# Patient Record
Sex: Male | Born: 1945 | Race: White | Hispanic: No | Marital: Married | Smoking: Former smoker
Health system: Southern US, Community
[De-identification: ages and names within clinical notes are randomized; demographics above are authoritative.]

## PROBLEM LIST (undated history)

## (undated) HISTORY — PX: KIDNEY TRANSPLANT: SHX239

## (undated) HISTORY — PX: COMBINED KIDNEY-PANCREAS TRANSPLANT: SHX1382

---

## 1998-10-18 ENCOUNTER — Inpatient Hospital Stay (HOSPITAL_COMMUNITY): Admission: AD | Admit: 1998-10-18 | Discharge: 1998-10-21 | Payer: Self-pay | Admitting: *Deleted

## 1998-11-16 ENCOUNTER — Ambulatory Visit (HOSPITAL_COMMUNITY): Admission: RE | Admit: 1998-11-16 | Discharge: 1998-11-16 | Payer: Self-pay | Admitting: Vascular Surgery

## 1999-02-03 ENCOUNTER — Ambulatory Visit (HOSPITAL_COMMUNITY): Admission: RE | Admit: 1999-02-03 | Discharge: 1999-02-03 | Payer: Self-pay | Admitting: Nephrology

## 1999-05-26 ENCOUNTER — Ambulatory Visit (HOSPITAL_COMMUNITY): Admission: RE | Admit: 1999-05-26 | Discharge: 1999-05-26 | Payer: Self-pay | Admitting: Vascular Surgery

## 1999-05-26 ENCOUNTER — Encounter: Payer: Self-pay | Admitting: Vascular Surgery

## 2000-09-08 DIAGNOSIS — Z940 Kidney transplant status: Secondary | ICD-10-CM

## 2000-09-08 HISTORY — DX: Kidney transplant status: Z94.0

## 2003-02-22 ENCOUNTER — Encounter: Payer: Self-pay | Admitting: Nephrology

## 2003-02-22 ENCOUNTER — Ambulatory Visit (HOSPITAL_COMMUNITY): Admission: RE | Admit: 2003-02-22 | Discharge: 2003-02-22 | Payer: Self-pay | Admitting: Nephrology

## 2003-03-09 ENCOUNTER — Encounter: Payer: Self-pay | Admitting: Nephrology

## 2003-03-09 ENCOUNTER — Ambulatory Visit (HOSPITAL_COMMUNITY): Admission: RE | Admit: 2003-03-09 | Discharge: 2003-03-09 | Payer: Self-pay | Admitting: Nephrology

## 2004-03-04 IMAGING — XA IR ANGIO/RENAL UNI WO/W FLUSH*L*
1 series · 16 of 24 positions shown · IV contrast (gadolinium)
Comparison: none

FINDINGS
CLINICAL DATA: PATIENT IS STATUS POST PLACEMENT OF RENAL TRANSPLANT IN [DATE] AND HAS RECENTLY HAD
WORSENING RENAL FUNCTION AND HYPERTENSION.  MRA PERFORMED ON 02/22/03 HAS DEMONSTRATED THE PRESENCE
OF TWO TRANSPLANT ARTERIES SUPPLYING A LEFT RENAL TRANSPLANT WITH SUGGESTION OF POTENTIALLY AREAS
OF NARROWING INVOLVING BOTH TRANSPLANT ARTERIES.  PATIENT NOW PRESENTS FOR ARTERIOGRAPHIC
ASSESSMENT WITH POSSIBLE INTERVENTION AS APPROPRIATE.
ARTERIOGRAPHY OF LEFT RENAL TRANSPLANT 03/09/03
CONTRAST:  CO2 AND 12 CC GADOLINIUM.
FLUORO TIME:  13.8 MINUTES.
INFORMED CONSENT WAS OBTAINED.  CONSCIOUS SEDATION DURING THE PROCEDURE WAS PROVIDED WITH IV VERSED
AND FENTANYL.
THE RIGHT GROIN WAS STERILELY PREPPED AND DRAPED.  LOCAL ANESTHESIA WAS PROVIDED WITH 1 PERCENT
LIDOCAINE.  THE RIGHT COMMON FEMORAL ARTERY WAS ACCESSED UTILIZING A MICROPUNCTURE [DATE] FRENCH
OMNI-FLUSH CATHETER WAS ADVANCED OVER A GUIDE WIRE INTO THE ABDOMINAL AORTA.  THIS WAS FORMED IN
THE DESCENDING THORACIC AORTA AND BROUGHT DOWN TO CATHETERIZE THE CONTRALATERAL LEFT COMMON ILIAC
ARTERY.  CO2 ARTERIOGRAPHY OF THE LEFT PELVIS WAS THEN PERFORMED WITH INJECTION OF THE COMMON ILIAC
ARTERY.
A 5 FRENCH VASCULAR SHEATH WAS THEN ADVANCED OVER A GUIDE WIRE TO THE LEVEL OF THE LEFT COMMON
ILIAC ARTERY.  DIFFERENT DIAGNOSTIC CATHETERS WERE THEN UTILIZED.  A DIAGNOSTIC CATHETER WAS
ADVANCED OVER A GUIDE WIRE INTO THE INFERIOR TRANSPLANT RENAL ARTERY AND SELECTIVE ARTERIOGRAPHY
PERFORMED WITH GADOLINIUM.  A PULL BACK PRESSURE MEASUREMENT WAS OBTAINED ACROSS THE PROXIMAL
VESSEL AND BACK INTO THE LEFT COMMON ILIAC ARTERY.
A CATHETER WAS THEN USED TO SELECTIVELY CATHETERIZE THE SUPERIOR TRANSPLANT RENAL ARTERY OFF OF THE
LEFT COMMON ILIAC ARTERY.  SELECTIVE ARTERIOGRAPHY WAS PERFORMED WITH GADOLINIUM.  PULL BACK
PRESSURE WAS ALSO OBTAINED IN THIS VESSEL.
AFTER THE PROCEDURE, CATHETER AND SHEATH WERE REMOVED AND HEMOSTASIS OBTAINED WITH MANUAL
COMPRESSION.
COMPLICATIONS:  NONE.

[Series 1: run · 16 of 33 slices shown]
[im 1/33]
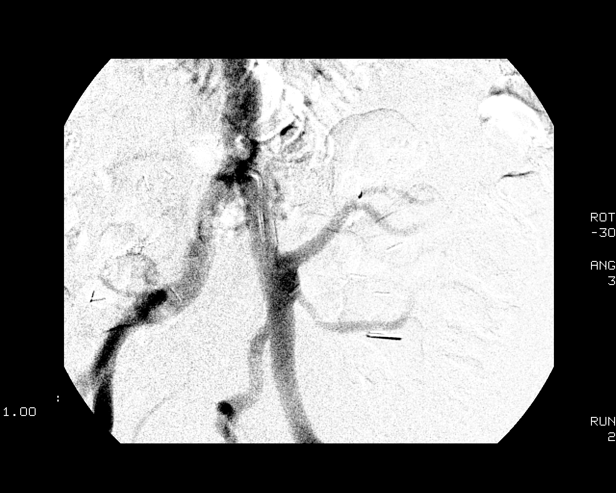
[im 3/33]
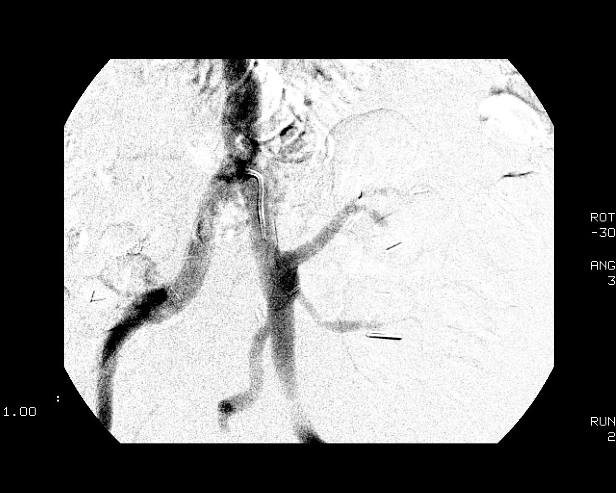
[im 5/33]
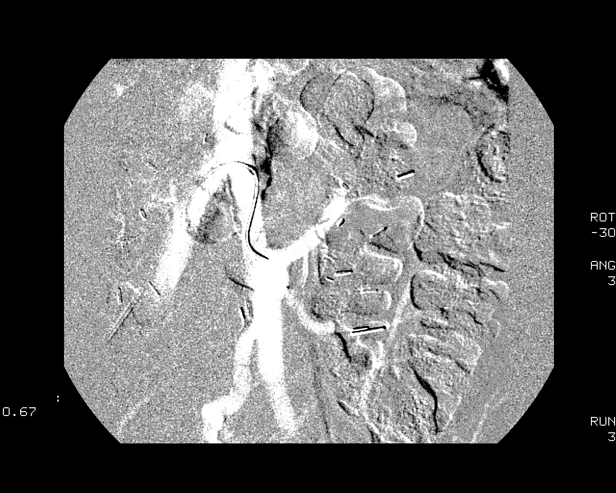
[im 7/33]
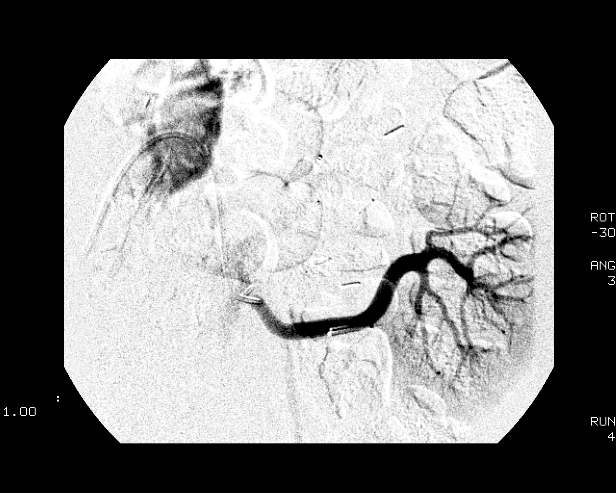
[im 9/33]
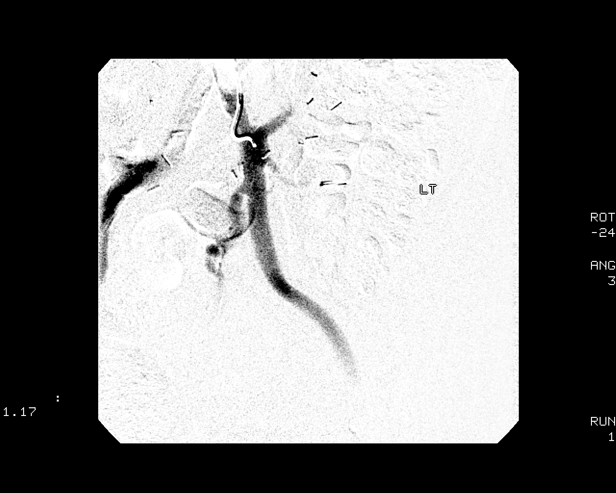
[im 12/33]
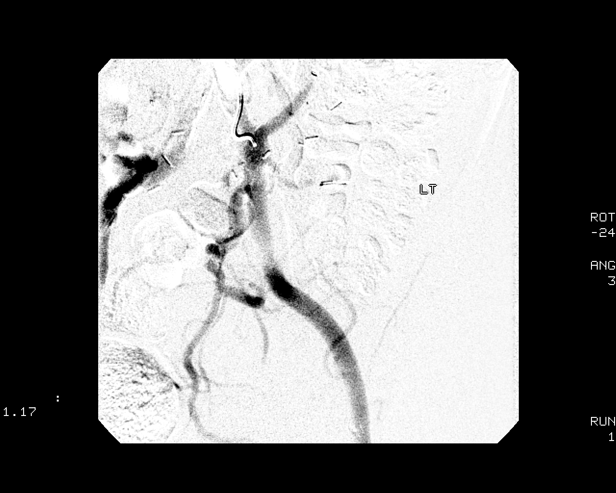
[im 13/33]
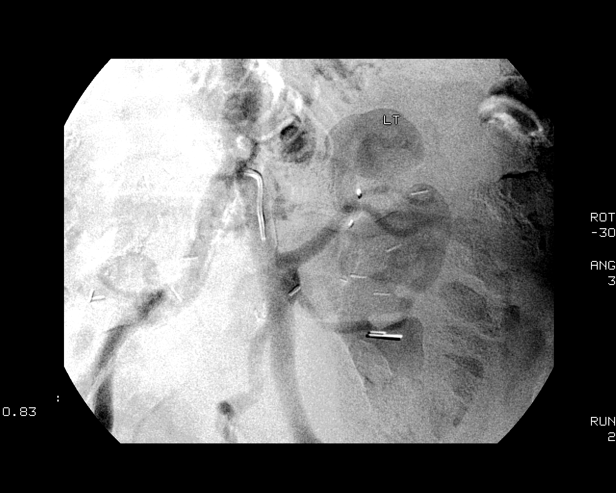
[im 16/33]
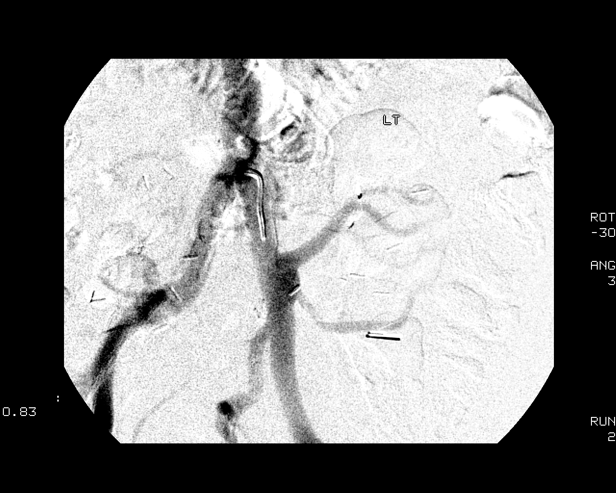
[im 17/33]
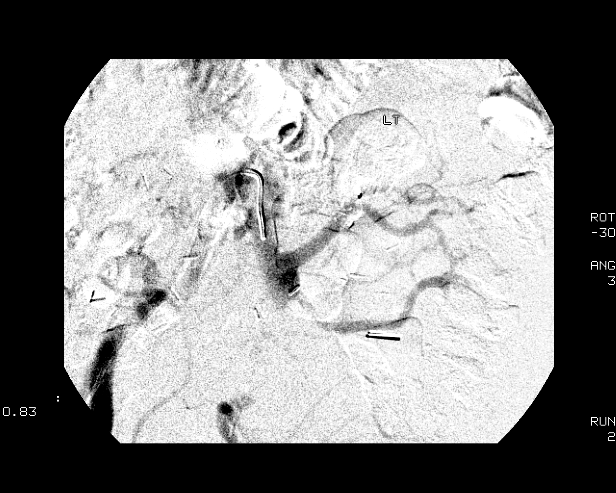
[im 20/33]
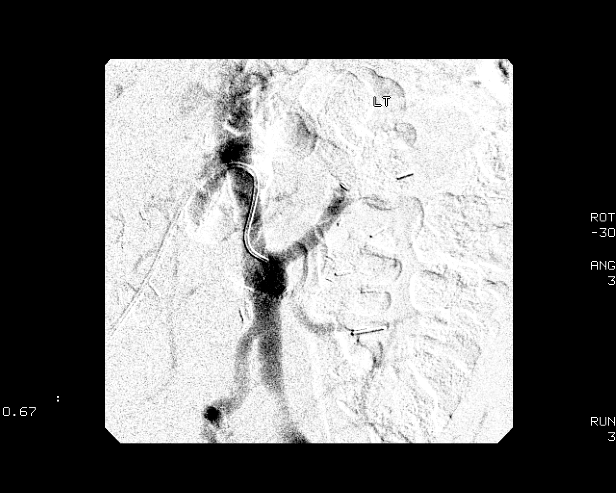
[im 21/33]
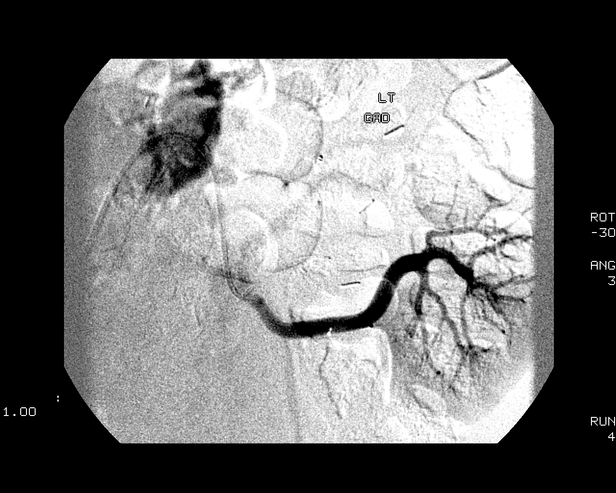
[im 24/33]
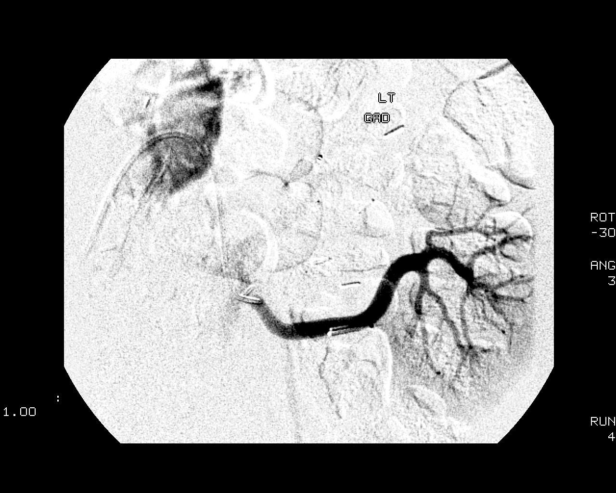
[im 26/33]
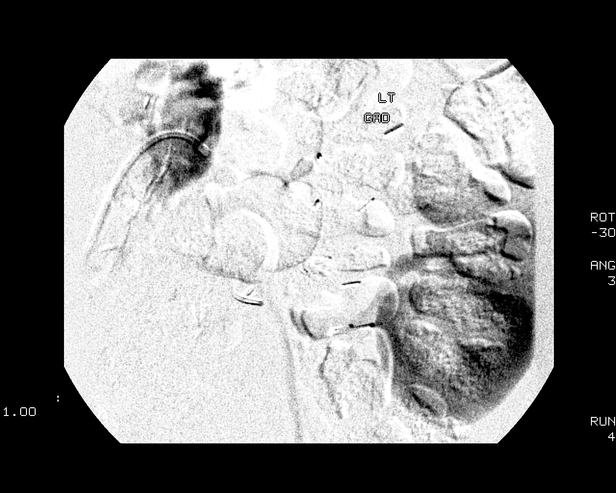
[im 28/33]
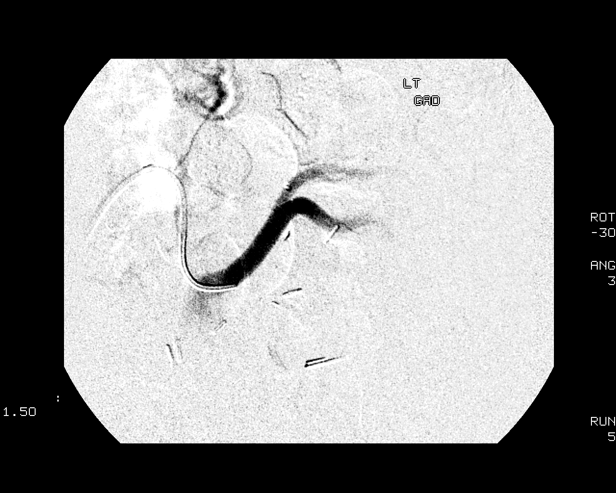
[im 30/33]
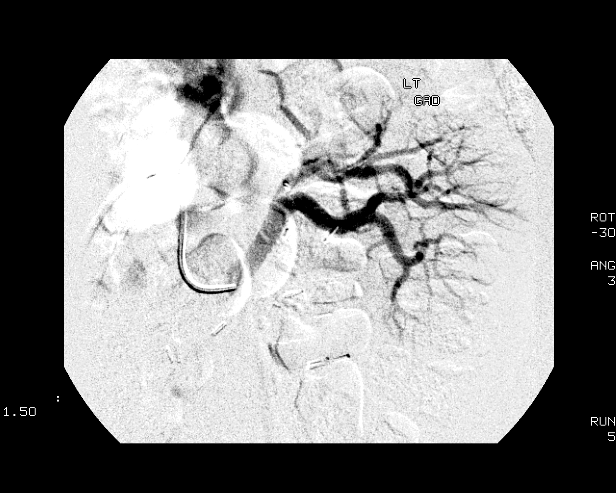
[im 33/33]
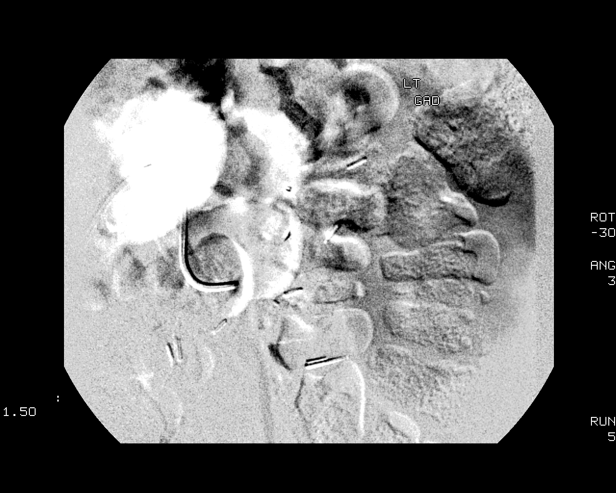

[16 of 24 positions shown; findings below may reference images not displayed]

FINDINGS: INITIAL CO2 ILIAC ARTERIOGRAM DEMONSTRATES A WIDELY PATENT LEFT COMMON ILIAC ARTERY
INCLUDING ITS ORIGIN.  REFLUX OF CO2 WAS ALSO ACCOMPLISHED INTO THE DISTAL AORTA WHICH IS NORMALLY
PATENT.  TWO SEPARATE TRANSPLANT RENAL ARTERIES ORIGINATE OFF OF THE DISTAL LEFT COMMON ILIAC
ARTERY AND SUPPLY THE LEFT ILIAC FOSSA TRANSPLANT.  THESE EMANATE OFF OF A PATULOUS SEGMENT OF THE
VESSEL BUT HAVE TWO DISTINCT ORIGINS.  BOTH OF THESE ORIGINS WERE WELL EVALUATED WITH PROXIMAL CO2
INJECTION DEMONSTRATING WIDELY PATENT ORIGINS AND NO EVIDENCE OF SIGNIFICANT STENOSIS.  THE
SUPERIOR TRANSPLANT RENAL ARTERY IS SLIGHTLY LARGER IN SIZE THAN THE INFERIOR ARTERY.
SELECTIVE ARTERIOGRAPHY OF THE INFERIOR TRANSPLANT ARTERY DEMONSTRATES A WIDELY PATENT VESSEL
WITHOUT EVIDENCE OF STENOSIS OR KINK.  MRA FINDINGS WERE LIKELY ON THE BASIS OF TWO ELONGATED
PARALLEL CLIPS LYING RIGHT AT AN AREA OF CURVATURE OF THE VESSEL LIKELY RESULTING IN THE MR
APPEARANCE OF POTENTIAL DISTAL VESSEL KINK VERSUS STENOSIS.  PULL BACK PRESSURE GRADIENT
MEASUREMENT WAS ACCOMPLISHED ACROSS THE PROXIMAL ARTERY DEMONSTRATING NO EVIDENCE OF RESTING
GRADIENT.  BRANCH VESSELS WERE FAIRLY WELL VISUALIZED AND ARE UNREMARKABLE IN APPEARANCE.  A
NEPHROGRAM PHASE WAS ALSO ABLE TO BE ACHIEVED.
SELECTIVE ARTERIOGRAPHY OF THE SUPERIOR TRANSPLANT RENAL ARTERY DEMONSTRATES A WIDELY PATENT VESSEL
SUPPLYING ROUGHLY 60 PERCENT OF THE TRANSPLANTED KIDNEY. THE BRANCH VESSELS HAVE A NORMAL
APPEARANCE AND NEPHROGRAM PHASE ALSO IS NORMAL.  PULL BACK PRESSURE MEASUREMENT ACROSS THE PROXIMAL
VESSEL SHOWS NO RESTING GRADIENT.
IMPRESSION
NORMAL APPEARANCE OF BOTH TRANSPLANT RENAL ARTERIES SUPPLYING THE LEFT ILIAC FOSSA TRANSPLANT
KIDNEY.  NO EVIDENCE OF RENAL ARTERY STENOSIS OR SIGNIFICANT ARTERY KINKING.  IN FLOW SUPPLY FROM
THE LEFT COMMON ILIAC ARTERY IS ALSO WIDELY PATENT.  NO EVIDENCE OF SIGNIFICANT ARTERIOGRAPHIC
EVIDENCE OF BRANCH VESSEL DISEASE.
         DIVISION OF NEPHROLOGY
        [REDACTED] [HOSPITAL]

## 2011-12-31 DIAGNOSIS — R197 Diarrhea, unspecified: Secondary | ICD-10-CM | POA: Diagnosis not present

## 2012-03-10 DIAGNOSIS — N189 Chronic kidney disease, unspecified: Secondary | ICD-10-CM | POA: Diagnosis not present

## 2012-03-10 DIAGNOSIS — Z6833 Body mass index (BMI) 33.0-33.9, adult: Secondary | ICD-10-CM | POA: Diagnosis not present

## 2012-03-10 DIAGNOSIS — I2510 Atherosclerotic heart disease of native coronary artery without angina pectoris: Secondary | ICD-10-CM | POA: Diagnosis not present

## 2012-03-10 DIAGNOSIS — E119 Type 2 diabetes mellitus without complications: Secondary | ICD-10-CM | POA: Diagnosis not present

## 2012-03-10 DIAGNOSIS — I10 Essential (primary) hypertension: Secondary | ICD-10-CM | POA: Diagnosis not present

## 2012-03-10 DIAGNOSIS — E785 Hyperlipidemia, unspecified: Secondary | ICD-10-CM | POA: Diagnosis not present

## 2012-03-10 DIAGNOSIS — Z79899 Other long term (current) drug therapy: Secondary | ICD-10-CM | POA: Diagnosis not present

## 2012-03-13 DIAGNOSIS — I6529 Occlusion and stenosis of unspecified carotid artery: Secondary | ICD-10-CM | POA: Diagnosis not present

## 2012-03-13 DIAGNOSIS — R0989 Other specified symptoms and signs involving the circulatory and respiratory systems: Secondary | ICD-10-CM | POA: Diagnosis not present

## 2012-03-13 DIAGNOSIS — I658 Occlusion and stenosis of other precerebral arteries: Secondary | ICD-10-CM | POA: Diagnosis not present

## 2012-04-14 DIAGNOSIS — I10 Essential (primary) hypertension: Secondary | ICD-10-CM | POA: Diagnosis not present

## 2012-04-14 DIAGNOSIS — Z9889 Other specified postprocedural states: Secondary | ICD-10-CM | POA: Diagnosis not present

## 2012-04-14 DIAGNOSIS — Z79899 Other long term (current) drug therapy: Secondary | ICD-10-CM | POA: Diagnosis not present

## 2012-04-14 DIAGNOSIS — Z940 Kidney transplant status: Secondary | ICD-10-CM | POA: Diagnosis not present

## 2012-04-14 DIAGNOSIS — Z48298 Encounter for aftercare following other organ transplant: Secondary | ICD-10-CM | POA: Diagnosis not present

## 2012-07-15 DIAGNOSIS — E119 Type 2 diabetes mellitus without complications: Secondary | ICD-10-CM | POA: Diagnosis not present

## 2012-07-15 DIAGNOSIS — I10 Essential (primary) hypertension: Secondary | ICD-10-CM | POA: Diagnosis not present

## 2012-07-15 DIAGNOSIS — E785 Hyperlipidemia, unspecified: Secondary | ICD-10-CM | POA: Diagnosis not present

## 2012-07-15 DIAGNOSIS — Z79899 Other long term (current) drug therapy: Secondary | ICD-10-CM | POA: Diagnosis not present

## 2012-07-15 DIAGNOSIS — N189 Chronic kidney disease, unspecified: Secondary | ICD-10-CM | POA: Diagnosis not present

## 2012-07-15 DIAGNOSIS — Z6827 Body mass index (BMI) 27.0-27.9, adult: Secondary | ICD-10-CM | POA: Diagnosis not present

## 2012-10-10 DIAGNOSIS — H579 Unspecified disorder of eye and adnexa: Secondary | ICD-10-CM | POA: Diagnosis not present

## 2012-10-10 DIAGNOSIS — E11359 Type 2 diabetes mellitus with proliferative diabetic retinopathy without macular edema: Secondary | ICD-10-CM | POA: Diagnosis not present

## 2012-10-10 DIAGNOSIS — Z961 Presence of intraocular lens: Secondary | ICD-10-CM

## 2012-10-10 DIAGNOSIS — H53002 Unspecified amblyopia, left eye: Secondary | ICD-10-CM

## 2012-10-10 DIAGNOSIS — E113599 Type 2 diabetes mellitus with proliferative diabetic retinopathy without macular edema, unspecified eye: Secondary | ICD-10-CM

## 2012-10-10 DIAGNOSIS — E1139 Type 2 diabetes mellitus with other diabetic ophthalmic complication: Secondary | ICD-10-CM | POA: Diagnosis not present

## 2012-10-10 DIAGNOSIS — H2512 Age-related nuclear cataract, left eye: Secondary | ICD-10-CM | POA: Insufficient documentation

## 2012-10-10 DIAGNOSIS — M8580 Other specified disorders of bone density and structure, unspecified site: Secondary | ICD-10-CM

## 2012-10-10 DIAGNOSIS — R0989 Other specified symptoms and signs involving the circulatory and respiratory systems: Secondary | ICD-10-CM

## 2012-10-10 DIAGNOSIS — I10 Essential (primary) hypertension: Secondary | ICD-10-CM

## 2012-10-10 DIAGNOSIS — I2510 Atherosclerotic heart disease of native coronary artery without angina pectoris: Secondary | ICD-10-CM

## 2012-10-10 DIAGNOSIS — E11311 Type 2 diabetes mellitus with unspecified diabetic retinopathy with macular edema: Secondary | ICD-10-CM

## 2012-10-10 HISTORY — DX: Essential (primary) hypertension: I10

## 2012-10-10 HISTORY — DX: Presence of intraocular lens: Z96.1

## 2012-10-10 HISTORY — DX: Type 2 diabetes mellitus with proliferative diabetic retinopathy without macular edema, unspecified eye: E11.3599

## 2012-10-10 HISTORY — DX: Atherosclerotic heart disease of native coronary artery without angina pectoris: I25.10

## 2012-10-10 HISTORY — DX: Other specified disorders of bone density and structure, unspecified site: M85.80

## 2012-10-10 HISTORY — DX: Unspecified amblyopia, left eye: H53.002

## 2012-10-10 HISTORY — DX: Type 2 diabetes mellitus with unspecified diabetic retinopathy with macular edema: E11.311

## 2012-10-10 HISTORY — DX: Age-related nuclear cataract, left eye: H25.12

## 2012-10-10 HISTORY — DX: Other specified symptoms and signs involving the circulatory and respiratory systems: R09.89

## 2012-10-14 DIAGNOSIS — E109 Type 1 diabetes mellitus without complications: Secondary | ICD-10-CM

## 2012-10-14 HISTORY — DX: Type 1 diabetes mellitus without complications: E10.9

## 2012-10-17 DIAGNOSIS — Z940 Kidney transplant status: Secondary | ICD-10-CM | POA: Diagnosis not present

## 2012-10-17 DIAGNOSIS — Z23 Encounter for immunization: Secondary | ICD-10-CM | POA: Diagnosis not present

## 2012-10-17 DIAGNOSIS — E109 Type 1 diabetes mellitus without complications: Secondary | ICD-10-CM | POA: Diagnosis not present

## 2012-10-17 DIAGNOSIS — I10 Essential (primary) hypertension: Secondary | ICD-10-CM | POA: Diagnosis not present

## 2012-10-30 DIAGNOSIS — H52219 Irregular astigmatism, unspecified eye: Secondary | ICD-10-CM | POA: Diagnosis not present

## 2012-12-12 DIAGNOSIS — E119 Type 2 diabetes mellitus without complications: Secondary | ICD-10-CM | POA: Diagnosis not present

## 2012-12-12 DIAGNOSIS — Z125 Encounter for screening for malignant neoplasm of prostate: Secondary | ICD-10-CM | POA: Diagnosis not present

## 2012-12-12 DIAGNOSIS — I10 Essential (primary) hypertension: Secondary | ICD-10-CM | POA: Diagnosis not present

## 2012-12-12 DIAGNOSIS — Z6827 Body mass index (BMI) 27.0-27.9, adult: Secondary | ICD-10-CM | POA: Diagnosis not present

## 2012-12-12 DIAGNOSIS — N189 Chronic kidney disease, unspecified: Secondary | ICD-10-CM | POA: Diagnosis not present

## 2012-12-12 DIAGNOSIS — E785 Hyperlipidemia, unspecified: Secondary | ICD-10-CM | POA: Diagnosis not present

## 2013-02-13 DIAGNOSIS — I10 Essential (primary) hypertension: Secondary | ICD-10-CM | POA: Diagnosis not present

## 2013-02-13 DIAGNOSIS — E785 Hyperlipidemia, unspecified: Secondary | ICD-10-CM | POA: Diagnosis not present

## 2013-02-13 DIAGNOSIS — E119 Type 2 diabetes mellitus without complications: Secondary | ICD-10-CM | POA: Diagnosis not present

## 2013-02-19 DIAGNOSIS — I6529 Occlusion and stenosis of unspecified carotid artery: Secondary | ICD-10-CM | POA: Diagnosis not present

## 2013-02-19 DIAGNOSIS — I658 Occlusion and stenosis of other precerebral arteries: Secondary | ICD-10-CM | POA: Diagnosis not present

## 2013-03-13 DIAGNOSIS — N189 Chronic kidney disease, unspecified: Secondary | ICD-10-CM | POA: Diagnosis not present

## 2013-04-17 DIAGNOSIS — Z940 Kidney transplant status: Secondary | ICD-10-CM | POA: Diagnosis not present

## 2013-04-17 DIAGNOSIS — Z79899 Other long term (current) drug therapy: Secondary | ICD-10-CM | POA: Diagnosis not present

## 2013-04-17 DIAGNOSIS — I10 Essential (primary) hypertension: Secondary | ICD-10-CM | POA: Diagnosis not present

## 2013-04-28 DIAGNOSIS — E11311 Type 2 diabetes mellitus with unspecified diabetic retinopathy with macular edema: Secondary | ICD-10-CM | POA: Diagnosis not present

## 2013-04-28 DIAGNOSIS — H543 Unqualified visual loss, both eyes: Secondary | ICD-10-CM | POA: Diagnosis not present

## 2013-04-28 DIAGNOSIS — E11359 Type 2 diabetes mellitus with proliferative diabetic retinopathy without macular edema: Secondary | ICD-10-CM | POA: Diagnosis not present

## 2013-04-28 DIAGNOSIS — E1139 Type 2 diabetes mellitus with other diabetic ophthalmic complication: Secondary | ICD-10-CM | POA: Diagnosis not present

## 2013-07-17 DIAGNOSIS — I10 Essential (primary) hypertension: Secondary | ICD-10-CM | POA: Diagnosis not present

## 2013-07-17 DIAGNOSIS — E785 Hyperlipidemia, unspecified: Secondary | ICD-10-CM | POA: Diagnosis not present

## 2013-07-17 DIAGNOSIS — N189 Chronic kidney disease, unspecified: Secondary | ICD-10-CM | POA: Diagnosis not present

## 2013-07-17 DIAGNOSIS — Z1331 Encounter for screening for depression: Secondary | ICD-10-CM | POA: Diagnosis not present

## 2013-07-17 DIAGNOSIS — Z9181 History of falling: Secondary | ICD-10-CM | POA: Diagnosis not present

## 2013-07-17 DIAGNOSIS — Z79899 Other long term (current) drug therapy: Secondary | ICD-10-CM | POA: Diagnosis not present

## 2013-07-17 DIAGNOSIS — E119 Type 2 diabetes mellitus without complications: Secondary | ICD-10-CM | POA: Diagnosis not present

## 2013-10-09 DIAGNOSIS — I10 Essential (primary) hypertension: Secondary | ICD-10-CM | POA: Diagnosis not present

## 2013-10-09 DIAGNOSIS — Z79899 Other long term (current) drug therapy: Secondary | ICD-10-CM | POA: Diagnosis not present

## 2013-10-09 DIAGNOSIS — Z9483 Pancreas transplant status: Secondary | ICD-10-CM | POA: Diagnosis not present

## 2013-10-09 DIAGNOSIS — Z940 Kidney transplant status: Secondary | ICD-10-CM | POA: Diagnosis not present

## 2013-11-06 DIAGNOSIS — E1139 Type 2 diabetes mellitus with other diabetic ophthalmic complication: Secondary | ICD-10-CM | POA: Diagnosis not present

## 2013-11-06 DIAGNOSIS — H543 Unqualified visual loss, both eyes: Secondary | ICD-10-CM | POA: Diagnosis not present

## 2013-11-06 DIAGNOSIS — E11359 Type 2 diabetes mellitus with proliferative diabetic retinopathy without macular edema: Secondary | ICD-10-CM | POA: Diagnosis not present

## 2013-11-24 DIAGNOSIS — I10 Essential (primary) hypertension: Secondary | ICD-10-CM | POA: Diagnosis not present

## 2013-11-24 DIAGNOSIS — E119 Type 2 diabetes mellitus without complications: Secondary | ICD-10-CM | POA: Diagnosis not present

## 2013-11-24 DIAGNOSIS — Z79899 Other long term (current) drug therapy: Secondary | ICD-10-CM | POA: Diagnosis not present

## 2013-11-24 DIAGNOSIS — N189 Chronic kidney disease, unspecified: Secondary | ICD-10-CM | POA: Diagnosis not present

## 2013-11-24 DIAGNOSIS — Z6828 Body mass index (BMI) 28.0-28.9, adult: Secondary | ICD-10-CM | POA: Diagnosis not present

## 2013-11-24 DIAGNOSIS — E785 Hyperlipidemia, unspecified: Secondary | ICD-10-CM | POA: Diagnosis not present

## 2013-12-03 DIAGNOSIS — N189 Chronic kidney disease, unspecified: Secondary | ICD-10-CM | POA: Diagnosis not present

## 2014-01-21 DIAGNOSIS — Z6829 Body mass index (BMI) 29.0-29.9, adult: Secondary | ICD-10-CM | POA: Diagnosis not present

## 2014-01-21 DIAGNOSIS — J209 Acute bronchitis, unspecified: Secondary | ICD-10-CM | POA: Diagnosis not present

## 2014-01-21 DIAGNOSIS — J019 Acute sinusitis, unspecified: Secondary | ICD-10-CM | POA: Diagnosis not present

## 2014-01-21 DIAGNOSIS — J029 Acute pharyngitis, unspecified: Secondary | ICD-10-CM | POA: Diagnosis not present

## 2014-02-25 DIAGNOSIS — Z79899 Other long term (current) drug therapy: Secondary | ICD-10-CM | POA: Diagnosis not present

## 2014-02-25 DIAGNOSIS — I10 Essential (primary) hypertension: Secondary | ICD-10-CM | POA: Diagnosis not present

## 2014-02-25 DIAGNOSIS — N189 Chronic kidney disease, unspecified: Secondary | ICD-10-CM | POA: Diagnosis not present

## 2014-02-25 DIAGNOSIS — Z125 Encounter for screening for malignant neoplasm of prostate: Secondary | ICD-10-CM | POA: Diagnosis not present

## 2014-02-25 DIAGNOSIS — Z6828 Body mass index (BMI) 28.0-28.9, adult: Secondary | ICD-10-CM | POA: Diagnosis not present

## 2014-02-25 DIAGNOSIS — E119 Type 2 diabetes mellitus without complications: Secondary | ICD-10-CM | POA: Diagnosis not present

## 2014-02-25 DIAGNOSIS — E785 Hyperlipidemia, unspecified: Secondary | ICD-10-CM | POA: Diagnosis not present

## 2014-03-29 DIAGNOSIS — E11319 Type 2 diabetes mellitus with unspecified diabetic retinopathy without macular edema: Secondary | ICD-10-CM | POA: Diagnosis present

## 2014-03-29 DIAGNOSIS — J988 Other specified respiratory disorders: Secondary | ICD-10-CM | POA: Diagnosis not present

## 2014-03-29 DIAGNOSIS — I10 Essential (primary) hypertension: Secondary | ICD-10-CM | POA: Diagnosis present

## 2014-03-29 DIAGNOSIS — Z79899 Other long term (current) drug therapy: Secondary | ICD-10-CM | POA: Diagnosis not present

## 2014-03-29 DIAGNOSIS — I739 Peripheral vascular disease, unspecified: Secondary | ICD-10-CM | POA: Diagnosis present

## 2014-03-29 DIAGNOSIS — D72829 Elevated white blood cell count, unspecified: Secondary | ICD-10-CM | POA: Diagnosis not present

## 2014-03-29 DIAGNOSIS — I959 Hypotension, unspecified: Secondary | ICD-10-CM | POA: Diagnosis not present

## 2014-03-29 DIAGNOSIS — I2510 Atherosclerotic heart disease of native coronary artery without angina pectoris: Secondary | ICD-10-CM | POA: Diagnosis present

## 2014-03-29 DIAGNOSIS — E1029 Type 1 diabetes mellitus with other diabetic kidney complication: Secondary | ICD-10-CM | POA: Diagnosis not present

## 2014-03-29 DIAGNOSIS — J398 Other specified diseases of upper respiratory tract: Secondary | ICD-10-CM | POA: Diagnosis not present

## 2014-03-29 DIAGNOSIS — J209 Acute bronchitis, unspecified: Secondary | ICD-10-CM | POA: Diagnosis present

## 2014-03-29 DIAGNOSIS — Z940 Kidney transplant status: Secondary | ICD-10-CM | POA: Diagnosis not present

## 2014-03-29 DIAGNOSIS — Z9483 Pancreas transplant status: Secondary | ICD-10-CM | POA: Diagnosis not present

## 2014-03-29 DIAGNOSIS — Z947 Corneal transplant status: Secondary | ICD-10-CM | POA: Diagnosis not present

## 2014-03-29 DIAGNOSIS — J9600 Acute respiratory failure, unspecified whether with hypoxia or hypercapnia: Secondary | ICD-10-CM | POA: Diagnosis not present

## 2014-03-29 DIAGNOSIS — N185 Chronic kidney disease, stage 5: Secondary | ICD-10-CM | POA: Diagnosis present

## 2014-03-29 DIAGNOSIS — E1039 Type 1 diabetes mellitus with other diabetic ophthalmic complication: Secondary | ICD-10-CM | POA: Diagnosis present

## 2014-03-29 DIAGNOSIS — D899 Disorder involving the immune mechanism, unspecified: Secondary | ICD-10-CM | POA: Diagnosis not present

## 2014-03-29 DIAGNOSIS — R509 Fever, unspecified: Secondary | ICD-10-CM | POA: Diagnosis not present

## 2014-03-29 DIAGNOSIS — R0902 Hypoxemia: Secondary | ICD-10-CM | POA: Diagnosis not present

## 2014-03-29 DIAGNOSIS — J159 Unspecified bacterial pneumonia: Secondary | ICD-10-CM | POA: Diagnosis not present

## 2014-03-29 DIAGNOSIS — Z23 Encounter for immunization: Secondary | ICD-10-CM | POA: Diagnosis not present

## 2014-03-29 DIAGNOSIS — E7800 Pure hypercholesterolemia, unspecified: Secondary | ICD-10-CM | POA: Diagnosis present

## 2014-04-02 DIAGNOSIS — J159 Unspecified bacterial pneumonia: Secondary | ICD-10-CM | POA: Diagnosis not present

## 2014-04-02 DIAGNOSIS — Z6828 Body mass index (BMI) 28.0-28.9, adult: Secondary | ICD-10-CM | POA: Diagnosis not present

## 2014-04-02 DIAGNOSIS — J9600 Acute respiratory failure, unspecified whether with hypoxia or hypercapnia: Secondary | ICD-10-CM | POA: Diagnosis not present

## 2014-04-16 DIAGNOSIS — Z79899 Other long term (current) drug therapy: Secondary | ICD-10-CM | POA: Diagnosis not present

## 2014-04-16 DIAGNOSIS — Z940 Kidney transplant status: Secondary | ICD-10-CM | POA: Diagnosis not present

## 2014-04-16 DIAGNOSIS — Z9483 Pancreas transplant status: Secondary | ICD-10-CM | POA: Diagnosis not present

## 2014-04-16 DIAGNOSIS — I10 Essential (primary) hypertension: Secondary | ICD-10-CM | POA: Diagnosis not present

## 2014-05-27 DIAGNOSIS — J029 Acute pharyngitis, unspecified: Secondary | ICD-10-CM | POA: Diagnosis not present

## 2014-05-27 DIAGNOSIS — J209 Acute bronchitis, unspecified: Secondary | ICD-10-CM | POA: Diagnosis not present

## 2014-05-27 DIAGNOSIS — Z6828 Body mass index (BMI) 28.0-28.9, adult: Secondary | ICD-10-CM | POA: Diagnosis not present

## 2014-06-03 DIAGNOSIS — I10 Essential (primary) hypertension: Secondary | ICD-10-CM | POA: Diagnosis not present

## 2014-06-03 DIAGNOSIS — N189 Chronic kidney disease, unspecified: Secondary | ICD-10-CM | POA: Diagnosis not present

## 2014-06-03 DIAGNOSIS — E119 Type 2 diabetes mellitus without complications: Secondary | ICD-10-CM | POA: Diagnosis not present

## 2014-06-03 DIAGNOSIS — E785 Hyperlipidemia, unspecified: Secondary | ICD-10-CM | POA: Diagnosis not present

## 2014-07-05 DIAGNOSIS — N189 Chronic kidney disease, unspecified: Secondary | ICD-10-CM | POA: Diagnosis not present

## 2014-09-10 DIAGNOSIS — I10 Essential (primary) hypertension: Secondary | ICD-10-CM | POA: Diagnosis not present

## 2014-09-10 DIAGNOSIS — Z6828 Body mass index (BMI) 28.0-28.9, adult: Secondary | ICD-10-CM | POA: Diagnosis not present

## 2014-09-10 DIAGNOSIS — E119 Type 2 diabetes mellitus without complications: Secondary | ICD-10-CM | POA: Diagnosis not present

## 2014-09-10 DIAGNOSIS — Z9181 History of falling: Secondary | ICD-10-CM | POA: Diagnosis not present

## 2014-09-10 DIAGNOSIS — Z79899 Other long term (current) drug therapy: Secondary | ICD-10-CM | POA: Diagnosis not present

## 2014-09-10 DIAGNOSIS — E785 Hyperlipidemia, unspecified: Secondary | ICD-10-CM | POA: Diagnosis not present

## 2014-09-10 DIAGNOSIS — Z1331 Encounter for screening for depression: Secondary | ICD-10-CM | POA: Diagnosis not present

## 2014-09-10 DIAGNOSIS — N189 Chronic kidney disease, unspecified: Secondary | ICD-10-CM | POA: Diagnosis not present

## 2014-10-15 DIAGNOSIS — M25551 Pain in right hip: Secondary | ICD-10-CM | POA: Diagnosis not present

## 2014-10-15 DIAGNOSIS — Z4822 Encounter for aftercare following kidney transplant: Secondary | ICD-10-CM | POA: Diagnosis not present

## 2014-10-15 DIAGNOSIS — Z9483 Pancreas transplant status: Secondary | ICD-10-CM | POA: Diagnosis not present

## 2014-10-15 DIAGNOSIS — Z23 Encounter for immunization: Secondary | ICD-10-CM | POA: Diagnosis not present

## 2014-10-15 DIAGNOSIS — Z5181 Encounter for therapeutic drug level monitoring: Secondary | ICD-10-CM | POA: Diagnosis not present

## 2014-10-15 DIAGNOSIS — Z7952 Long term (current) use of systemic steroids: Secondary | ICD-10-CM | POA: Diagnosis not present

## 2014-10-15 DIAGNOSIS — Z87891 Personal history of nicotine dependence: Secondary | ICD-10-CM | POA: Diagnosis not present

## 2014-10-15 DIAGNOSIS — Z7982 Long term (current) use of aspirin: Secondary | ICD-10-CM | POA: Diagnosis not present

## 2014-10-15 DIAGNOSIS — I10 Essential (primary) hypertension: Secondary | ICD-10-CM | POA: Diagnosis not present

## 2014-10-15 DIAGNOSIS — Z9225 Personal history of immunosupression therapy: Secondary | ICD-10-CM | POA: Diagnosis not present

## 2014-10-15 DIAGNOSIS — Z792 Long term (current) use of antibiotics: Secondary | ICD-10-CM | POA: Diagnosis not present

## 2014-10-15 DIAGNOSIS — Z940 Kidney transplant status: Secondary | ICD-10-CM | POA: Diagnosis not present

## 2014-10-15 DIAGNOSIS — Z79899 Other long term (current) drug therapy: Secondary | ICD-10-CM | POA: Diagnosis not present

## 2014-10-15 DIAGNOSIS — G8929 Other chronic pain: Secondary | ICD-10-CM

## 2014-10-15 HISTORY — DX: Other chronic pain: G89.29

## 2014-11-08 DIAGNOSIS — E11359 Type 2 diabetes mellitus with proliferative diabetic retinopathy without macular edema: Secondary | ICD-10-CM | POA: Diagnosis not present

## 2014-11-08 DIAGNOSIS — H5440 Blindness, one eye, unspecified eye: Secondary | ICD-10-CM | POA: Diagnosis not present

## 2014-11-08 DIAGNOSIS — H268 Other specified cataract: Secondary | ICD-10-CM | POA: Diagnosis not present

## 2014-11-08 DIAGNOSIS — H5442 Blindness, left eye, normal vision right eye: Secondary | ICD-10-CM | POA: Diagnosis not present

## 2014-12-15 DIAGNOSIS — E119 Type 2 diabetes mellitus without complications: Secondary | ICD-10-CM | POA: Diagnosis not present

## 2014-12-15 DIAGNOSIS — Z6828 Body mass index (BMI) 28.0-28.9, adult: Secondary | ICD-10-CM | POA: Diagnosis not present

## 2014-12-15 DIAGNOSIS — Z79899 Other long term (current) drug therapy: Secondary | ICD-10-CM | POA: Diagnosis not present

## 2014-12-15 DIAGNOSIS — I10 Essential (primary) hypertension: Secondary | ICD-10-CM | POA: Diagnosis not present

## 2014-12-15 DIAGNOSIS — N189 Chronic kidney disease, unspecified: Secondary | ICD-10-CM | POA: Diagnosis not present

## 2014-12-15 DIAGNOSIS — M25551 Pain in right hip: Secondary | ICD-10-CM | POA: Diagnosis not present

## 2014-12-15 DIAGNOSIS — E785 Hyperlipidemia, unspecified: Secondary | ICD-10-CM | POA: Diagnosis not present

## 2015-02-14 DIAGNOSIS — Z6828 Body mass index (BMI) 28.0-28.9, adult: Secondary | ICD-10-CM | POA: Diagnosis not present

## 2015-02-14 DIAGNOSIS — J208 Acute bronchitis due to other specified organisms: Secondary | ICD-10-CM | POA: Diagnosis not present

## 2015-02-14 DIAGNOSIS — R110 Nausea: Secondary | ICD-10-CM | POA: Diagnosis not present

## 2015-03-03 DIAGNOSIS — Z6828 Body mass index (BMI) 28.0-28.9, adult: Secondary | ICD-10-CM | POA: Diagnosis not present

## 2015-03-03 DIAGNOSIS — Z125 Encounter for screening for malignant neoplasm of prostate: Secondary | ICD-10-CM | POA: Diagnosis not present

## 2015-03-03 DIAGNOSIS — E119 Type 2 diabetes mellitus without complications: Secondary | ICD-10-CM | POA: Diagnosis not present

## 2015-03-03 DIAGNOSIS — Z79899 Other long term (current) drug therapy: Secondary | ICD-10-CM | POA: Diagnosis not present

## 2015-03-03 DIAGNOSIS — I10 Essential (primary) hypertension: Secondary | ICD-10-CM | POA: Diagnosis not present

## 2015-03-03 DIAGNOSIS — N189 Chronic kidney disease, unspecified: Secondary | ICD-10-CM | POA: Diagnosis not present

## 2015-03-03 DIAGNOSIS — E785 Hyperlipidemia, unspecified: Secondary | ICD-10-CM | POA: Diagnosis not present

## 2015-04-18 DIAGNOSIS — I10 Essential (primary) hypertension: Secondary | ICD-10-CM | POA: Diagnosis not present

## 2015-04-18 DIAGNOSIS — Z940 Kidney transplant status: Secondary | ICD-10-CM | POA: Diagnosis not present

## 2015-04-18 DIAGNOSIS — Z9225 Personal history of immunosupression therapy: Secondary | ICD-10-CM | POA: Diagnosis not present

## 2015-04-18 DIAGNOSIS — D899 Disorder involving the immune mechanism, unspecified: Secondary | ICD-10-CM | POA: Diagnosis not present

## 2015-07-07 DIAGNOSIS — Z6828 Body mass index (BMI) 28.0-28.9, adult: Secondary | ICD-10-CM | POA: Diagnosis not present

## 2015-07-07 DIAGNOSIS — Z9181 History of falling: Secondary | ICD-10-CM | POA: Diagnosis not present

## 2015-07-07 DIAGNOSIS — Z79899 Other long term (current) drug therapy: Secondary | ICD-10-CM | POA: Diagnosis not present

## 2015-07-07 DIAGNOSIS — Z1389 Encounter for screening for other disorder: Secondary | ICD-10-CM | POA: Diagnosis not present

## 2015-07-07 DIAGNOSIS — Z23 Encounter for immunization: Secondary | ICD-10-CM | POA: Diagnosis not present

## 2015-07-07 DIAGNOSIS — E785 Hyperlipidemia, unspecified: Secondary | ICD-10-CM | POA: Diagnosis not present

## 2015-07-07 DIAGNOSIS — E119 Type 2 diabetes mellitus without complications: Secondary | ICD-10-CM | POA: Diagnosis not present

## 2015-07-07 DIAGNOSIS — N189 Chronic kidney disease, unspecified: Secondary | ICD-10-CM | POA: Diagnosis not present

## 2015-07-07 DIAGNOSIS — I10 Essential (primary) hypertension: Secondary | ICD-10-CM | POA: Diagnosis not present

## 2015-10-17 DIAGNOSIS — Z4822 Encounter for aftercare following kidney transplant: Secondary | ICD-10-CM | POA: Diagnosis not present

## 2015-10-17 DIAGNOSIS — Z9483 Pancreas transplant status: Secondary | ICD-10-CM | POA: Diagnosis not present

## 2015-10-17 DIAGNOSIS — I10 Essential (primary) hypertension: Secondary | ICD-10-CM | POA: Diagnosis not present

## 2015-10-17 DIAGNOSIS — Z23 Encounter for immunization: Secondary | ICD-10-CM | POA: Diagnosis not present

## 2015-10-17 DIAGNOSIS — Z792 Long term (current) use of antibiotics: Secondary | ICD-10-CM | POA: Diagnosis not present

## 2015-10-17 DIAGNOSIS — R809 Proteinuria, unspecified: Secondary | ICD-10-CM | POA: Diagnosis not present

## 2015-10-17 DIAGNOSIS — Z940 Kidney transplant status: Secondary | ICD-10-CM | POA: Diagnosis not present

## 2015-10-17 DIAGNOSIS — Z79899 Other long term (current) drug therapy: Secondary | ICD-10-CM | POA: Diagnosis not present

## 2015-10-17 DIAGNOSIS — D899 Disorder involving the immune mechanism, unspecified: Secondary | ICD-10-CM | POA: Diagnosis not present

## 2015-11-07 DIAGNOSIS — N189 Chronic kidney disease, unspecified: Secondary | ICD-10-CM | POA: Diagnosis not present

## 2015-11-07 DIAGNOSIS — E1139 Type 2 diabetes mellitus with other diabetic ophthalmic complication: Secondary | ICD-10-CM | POA: Diagnosis not present

## 2015-11-07 DIAGNOSIS — I10 Essential (primary) hypertension: Secondary | ICD-10-CM | POA: Diagnosis not present

## 2015-11-07 DIAGNOSIS — E785 Hyperlipidemia, unspecified: Secondary | ICD-10-CM | POA: Diagnosis not present

## 2015-11-07 DIAGNOSIS — G8929 Other chronic pain: Secondary | ICD-10-CM | POA: Diagnosis not present

## 2015-11-07 DIAGNOSIS — Z6828 Body mass index (BMI) 28.0-28.9, adult: Secondary | ICD-10-CM | POA: Diagnosis not present

## 2015-11-07 DIAGNOSIS — M25551 Pain in right hip: Secondary | ICD-10-CM | POA: Diagnosis not present

## 2015-11-07 DIAGNOSIS — Z79899 Other long term (current) drug therapy: Secondary | ICD-10-CM | POA: Diagnosis not present

## 2015-11-07 DIAGNOSIS — Z1389 Encounter for screening for other disorder: Secondary | ICD-10-CM | POA: Diagnosis not present

## 2015-11-11 DIAGNOSIS — E113519 Type 2 diabetes mellitus with proliferative diabetic retinopathy with macular edema, unspecified eye: Secondary | ICD-10-CM | POA: Diagnosis not present

## 2015-11-11 DIAGNOSIS — E113511 Type 2 diabetes mellitus with proliferative diabetic retinopathy with macular edema, right eye: Secondary | ICD-10-CM | POA: Diagnosis not present

## 2015-11-11 DIAGNOSIS — E11311 Type 2 diabetes mellitus with unspecified diabetic retinopathy with macular edema: Secondary | ICD-10-CM | POA: Diagnosis not present

## 2015-11-11 DIAGNOSIS — H35371 Puckering of macula, right eye: Secondary | ICD-10-CM | POA: Diagnosis not present

## 2015-11-22 DIAGNOSIS — Z6828 Body mass index (BMI) 28.0-28.9, adult: Secondary | ICD-10-CM | POA: Diagnosis not present

## 2015-11-22 DIAGNOSIS — A084 Viral intestinal infection, unspecified: Secondary | ICD-10-CM | POA: Diagnosis not present

## 2015-11-22 DIAGNOSIS — Z1389 Encounter for screening for other disorder: Secondary | ICD-10-CM | POA: Diagnosis not present

## 2015-12-15 DIAGNOSIS — Z6829 Body mass index (BMI) 29.0-29.9, adult: Secondary | ICD-10-CM | POA: Diagnosis not present

## 2015-12-15 DIAGNOSIS — J208 Acute bronchitis due to other specified organisms: Secondary | ICD-10-CM | POA: Diagnosis not present

## 2015-12-15 DIAGNOSIS — J019 Acute sinusitis, unspecified: Secondary | ICD-10-CM | POA: Diagnosis not present

## 2016-01-20 DIAGNOSIS — M47816 Spondylosis without myelopathy or radiculopathy, lumbar region: Secondary | ICD-10-CM | POA: Diagnosis not present

## 2016-01-20 DIAGNOSIS — M545 Low back pain: Secondary | ICD-10-CM | POA: Diagnosis not present

## 2016-01-20 DIAGNOSIS — Z6829 Body mass index (BMI) 29.0-29.9, adult: Secondary | ICD-10-CM | POA: Diagnosis not present

## 2016-01-20 DIAGNOSIS — M47896 Other spondylosis, lumbar region: Secondary | ICD-10-CM | POA: Diagnosis not present

## 2016-01-20 DIAGNOSIS — M4806 Spinal stenosis, lumbar region: Secondary | ICD-10-CM | POA: Diagnosis not present

## 2016-03-12 DIAGNOSIS — Z6828 Body mass index (BMI) 28.0-28.9, adult: Secondary | ICD-10-CM | POA: Diagnosis not present

## 2016-03-12 DIAGNOSIS — Z0000 Encounter for general adult medical examination without abnormal findings: Secondary | ICD-10-CM | POA: Diagnosis not present

## 2016-03-12 DIAGNOSIS — E785 Hyperlipidemia, unspecified: Secondary | ICD-10-CM | POA: Diagnosis not present

## 2016-03-12 DIAGNOSIS — I10 Essential (primary) hypertension: Secondary | ICD-10-CM | POA: Diagnosis not present

## 2016-03-12 DIAGNOSIS — N189 Chronic kidney disease, unspecified: Secondary | ICD-10-CM | POA: Diagnosis not present

## 2016-03-12 DIAGNOSIS — E663 Overweight: Secondary | ICD-10-CM | POA: Diagnosis not present

## 2016-03-12 DIAGNOSIS — M4696 Unspecified inflammatory spondylopathy, lumbar region: Secondary | ICD-10-CM | POA: Diagnosis not present

## 2016-03-12 DIAGNOSIS — Z79899 Other long term (current) drug therapy: Secondary | ICD-10-CM | POA: Diagnosis not present

## 2016-03-12 DIAGNOSIS — Z125 Encounter for screening for malignant neoplasm of prostate: Secondary | ICD-10-CM | POA: Diagnosis not present

## 2016-03-12 DIAGNOSIS — E1139 Type 2 diabetes mellitus with other diabetic ophthalmic complication: Secondary | ICD-10-CM | POA: Diagnosis not present

## 2016-04-16 DIAGNOSIS — I10 Essential (primary) hypertension: Secondary | ICD-10-CM | POA: Diagnosis not present

## 2016-04-16 DIAGNOSIS — Z940 Kidney transplant status: Secondary | ICD-10-CM | POA: Diagnosis not present

## 2016-04-16 DIAGNOSIS — D899 Disorder involving the immune mechanism, unspecified: Secondary | ICD-10-CM | POA: Diagnosis not present

## 2016-07-16 DIAGNOSIS — Z6828 Body mass index (BMI) 28.0-28.9, adult: Secondary | ICD-10-CM | POA: Diagnosis not present

## 2016-07-16 DIAGNOSIS — N189 Chronic kidney disease, unspecified: Secondary | ICD-10-CM | POA: Diagnosis not present

## 2016-07-16 DIAGNOSIS — Z79899 Other long term (current) drug therapy: Secondary | ICD-10-CM | POA: Diagnosis not present

## 2016-07-16 DIAGNOSIS — Z9181 History of falling: Secondary | ICD-10-CM | POA: Diagnosis not present

## 2016-07-16 DIAGNOSIS — E1129 Type 2 diabetes mellitus with other diabetic kidney complication: Secondary | ICD-10-CM | POA: Diagnosis not present

## 2016-07-16 DIAGNOSIS — E663 Overweight: Secondary | ICD-10-CM | POA: Diagnosis not present

## 2016-07-16 DIAGNOSIS — E785 Hyperlipidemia, unspecified: Secondary | ICD-10-CM | POA: Diagnosis not present

## 2016-07-16 DIAGNOSIS — I10 Essential (primary) hypertension: Secondary | ICD-10-CM | POA: Diagnosis not present

## 2016-07-16 DIAGNOSIS — E1139 Type 2 diabetes mellitus with other diabetic ophthalmic complication: Secondary | ICD-10-CM | POA: Diagnosis not present

## 2016-10-22 DIAGNOSIS — D899 Disorder involving the immune mechanism, unspecified: Secondary | ICD-10-CM | POA: Diagnosis not present

## 2016-10-22 DIAGNOSIS — Z4822 Encounter for aftercare following kidney transplant: Secondary | ICD-10-CM | POA: Diagnosis not present

## 2016-10-22 DIAGNOSIS — M549 Dorsalgia, unspecified: Secondary | ICD-10-CM | POA: Diagnosis not present

## 2016-10-22 DIAGNOSIS — Z23 Encounter for immunization: Secondary | ICD-10-CM | POA: Diagnosis not present

## 2016-10-22 DIAGNOSIS — Z940 Kidney transplant status: Secondary | ICD-10-CM | POA: Diagnosis not present

## 2016-10-22 DIAGNOSIS — I10 Essential (primary) hypertension: Secondary | ICD-10-CM | POA: Diagnosis not present

## 2016-10-26 DIAGNOSIS — E113513 Type 2 diabetes mellitus with proliferative diabetic retinopathy with macular edema, bilateral: Secondary | ICD-10-CM | POA: Diagnosis not present

## 2016-10-26 DIAGNOSIS — E113519 Type 2 diabetes mellitus with proliferative diabetic retinopathy with macular edema, unspecified eye: Secondary | ICD-10-CM | POA: Diagnosis not present

## 2016-11-16 DIAGNOSIS — Z6827 Body mass index (BMI) 27.0-27.9, adult: Secondary | ICD-10-CM | POA: Diagnosis not present

## 2016-11-16 DIAGNOSIS — I129 Hypertensive chronic kidney disease with stage 1 through stage 4 chronic kidney disease, or unspecified chronic kidney disease: Secondary | ICD-10-CM | POA: Diagnosis not present

## 2016-11-16 DIAGNOSIS — Z23 Encounter for immunization: Secondary | ICD-10-CM | POA: Diagnosis not present

## 2016-11-16 DIAGNOSIS — E785 Hyperlipidemia, unspecified: Secondary | ICD-10-CM | POA: Diagnosis not present

## 2016-11-16 DIAGNOSIS — E1129 Type 2 diabetes mellitus with other diabetic kidney complication: Secondary | ICD-10-CM | POA: Diagnosis not present

## 2016-11-16 DIAGNOSIS — E10319 Type 1 diabetes mellitus with unspecified diabetic retinopathy without macular edema: Secondary | ICD-10-CM | POA: Diagnosis not present

## 2016-11-16 DIAGNOSIS — Z79899 Other long term (current) drug therapy: Secondary | ICD-10-CM | POA: Diagnosis not present

## 2016-11-16 DIAGNOSIS — N189 Chronic kidney disease, unspecified: Secondary | ICD-10-CM | POA: Diagnosis not present

## 2017-03-19 DIAGNOSIS — N189 Chronic kidney disease, unspecified: Secondary | ICD-10-CM | POA: Diagnosis not present

## 2017-03-19 DIAGNOSIS — Z6829 Body mass index (BMI) 29.0-29.9, adult: Secondary | ICD-10-CM | POA: Diagnosis not present

## 2017-03-19 DIAGNOSIS — I129 Hypertensive chronic kidney disease with stage 1 through stage 4 chronic kidney disease, or unspecified chronic kidney disease: Secondary | ICD-10-CM | POA: Diagnosis not present

## 2017-03-19 DIAGNOSIS — Z1389 Encounter for screening for other disorder: Secondary | ICD-10-CM | POA: Diagnosis not present

## 2017-03-19 DIAGNOSIS — E785 Hyperlipidemia, unspecified: Secondary | ICD-10-CM | POA: Diagnosis not present

## 2017-03-19 DIAGNOSIS — Z79899 Other long term (current) drug therapy: Secondary | ICD-10-CM | POA: Diagnosis not present

## 2017-03-19 DIAGNOSIS — E10319 Type 1 diabetes mellitus with unspecified diabetic retinopathy without macular edema: Secondary | ICD-10-CM | POA: Diagnosis not present

## 2017-03-19 DIAGNOSIS — Z9181 History of falling: Secondary | ICD-10-CM | POA: Diagnosis not present

## 2017-03-19 DIAGNOSIS — E1129 Type 2 diabetes mellitus with other diabetic kidney complication: Secondary | ICD-10-CM | POA: Diagnosis not present

## 2017-03-19 DIAGNOSIS — Z125 Encounter for screening for malignant neoplasm of prostate: Secondary | ICD-10-CM | POA: Diagnosis not present

## 2017-04-04 DIAGNOSIS — Z7952 Long term (current) use of systemic steroids: Secondary | ICD-10-CM | POA: Diagnosis not present

## 2017-04-04 DIAGNOSIS — E785 Hyperlipidemia, unspecified: Secondary | ICD-10-CM | POA: Diagnosis not present

## 2017-04-04 DIAGNOSIS — Z0000 Encounter for general adult medical examination without abnormal findings: Secondary | ICD-10-CM | POA: Diagnosis not present

## 2017-04-04 DIAGNOSIS — Z125 Encounter for screening for malignant neoplasm of prostate: Secondary | ICD-10-CM | POA: Diagnosis not present

## 2017-04-04 DIAGNOSIS — Z9181 History of falling: Secondary | ICD-10-CM | POA: Diagnosis not present

## 2017-04-04 DIAGNOSIS — Z136 Encounter for screening for cardiovascular disorders: Secondary | ICD-10-CM | POA: Diagnosis not present

## 2017-04-04 DIAGNOSIS — Z1389 Encounter for screening for other disorder: Secondary | ICD-10-CM | POA: Diagnosis not present

## 2017-04-29 DIAGNOSIS — I10 Essential (primary) hypertension: Secondary | ICD-10-CM | POA: Diagnosis not present

## 2017-04-29 DIAGNOSIS — D899 Disorder involving the immune mechanism, unspecified: Secondary | ICD-10-CM | POA: Diagnosis not present

## 2017-04-29 DIAGNOSIS — Z940 Kidney transplant status: Secondary | ICD-10-CM | POA: Diagnosis not present

## 2017-05-03 DIAGNOSIS — M810 Age-related osteoporosis without current pathological fracture: Secondary | ICD-10-CM | POA: Diagnosis not present

## 2017-05-03 DIAGNOSIS — Z7952 Long term (current) use of systemic steroids: Secondary | ICD-10-CM | POA: Diagnosis not present

## 2017-05-30 DIAGNOSIS — E10319 Type 1 diabetes mellitus with unspecified diabetic retinopathy without macular edema: Secondary | ICD-10-CM | POA: Diagnosis not present

## 2017-05-30 DIAGNOSIS — Z6828 Body mass index (BMI) 28.0-28.9, adult: Secondary | ICD-10-CM | POA: Diagnosis not present

## 2017-05-30 DIAGNOSIS — M810 Age-related osteoporosis without current pathological fracture: Secondary | ICD-10-CM | POA: Diagnosis not present

## 2017-05-30 DIAGNOSIS — E785 Hyperlipidemia, unspecified: Secondary | ICD-10-CM | POA: Diagnosis not present

## 2017-05-30 DIAGNOSIS — I129 Hypertensive chronic kidney disease with stage 1 through stage 4 chronic kidney disease, or unspecified chronic kidney disease: Secondary | ICD-10-CM | POA: Diagnosis not present

## 2017-05-30 DIAGNOSIS — Z79899 Other long term (current) drug therapy: Secondary | ICD-10-CM | POA: Diagnosis not present

## 2017-05-30 DIAGNOSIS — N189 Chronic kidney disease, unspecified: Secondary | ICD-10-CM | POA: Diagnosis not present

## 2017-05-30 DIAGNOSIS — E1129 Type 2 diabetes mellitus with other diabetic kidney complication: Secondary | ICD-10-CM | POA: Diagnosis not present

## 2017-09-27 DIAGNOSIS — E10319 Type 1 diabetes mellitus with unspecified diabetic retinopathy without macular edema: Secondary | ICD-10-CM | POA: Diagnosis not present

## 2017-09-27 DIAGNOSIS — Z6828 Body mass index (BMI) 28.0-28.9, adult: Secondary | ICD-10-CM | POA: Diagnosis not present

## 2017-09-27 DIAGNOSIS — I129 Hypertensive chronic kidney disease with stage 1 through stage 4 chronic kidney disease, or unspecified chronic kidney disease: Secondary | ICD-10-CM | POA: Diagnosis not present

## 2017-09-27 DIAGNOSIS — Z23 Encounter for immunization: Secondary | ICD-10-CM | POA: Diagnosis not present

## 2017-09-27 DIAGNOSIS — E1129 Type 2 diabetes mellitus with other diabetic kidney complication: Secondary | ICD-10-CM | POA: Diagnosis not present

## 2017-09-27 DIAGNOSIS — E785 Hyperlipidemia, unspecified: Secondary | ICD-10-CM | POA: Diagnosis not present

## 2017-09-27 DIAGNOSIS — M810 Age-related osteoporosis without current pathological fracture: Secondary | ICD-10-CM | POA: Diagnosis not present

## 2017-09-27 DIAGNOSIS — Z79899 Other long term (current) drug therapy: Secondary | ICD-10-CM | POA: Diagnosis not present

## 2017-09-27 DIAGNOSIS — N189 Chronic kidney disease, unspecified: Secondary | ICD-10-CM | POA: Diagnosis not present

## 2017-09-27 DIAGNOSIS — Z9181 History of falling: Secondary | ICD-10-CM | POA: Diagnosis not present

## 2017-10-28 DIAGNOSIS — D899 Disorder involving the immune mechanism, unspecified: Secondary | ICD-10-CM | POA: Diagnosis not present

## 2017-10-28 DIAGNOSIS — Z940 Kidney transplant status: Secondary | ICD-10-CM | POA: Diagnosis not present

## 2017-10-28 DIAGNOSIS — I10 Essential (primary) hypertension: Secondary | ICD-10-CM | POA: Diagnosis not present

## 2017-11-08 DIAGNOSIS — E103553 Type 1 diabetes mellitus with stable proliferative diabetic retinopathy, bilateral: Secondary | ICD-10-CM | POA: Diagnosis not present

## 2017-11-08 DIAGNOSIS — H524 Presbyopia: Secondary | ICD-10-CM | POA: Diagnosis not present

## 2017-12-09 DIAGNOSIS — H40051 Ocular hypertension, right eye: Secondary | ICD-10-CM | POA: Diagnosis not present

## 2018-02-03 DIAGNOSIS — Z6828 Body mass index (BMI) 28.0-28.9, adult: Secondary | ICD-10-CM | POA: Diagnosis not present

## 2018-02-03 DIAGNOSIS — I129 Hypertensive chronic kidney disease with stage 1 through stage 4 chronic kidney disease, or unspecified chronic kidney disease: Secondary | ICD-10-CM | POA: Diagnosis not present

## 2018-02-03 DIAGNOSIS — E785 Hyperlipidemia, unspecified: Secondary | ICD-10-CM | POA: Diagnosis not present

## 2018-02-03 DIAGNOSIS — Z79899 Other long term (current) drug therapy: Secondary | ICD-10-CM | POA: Diagnosis not present

## 2018-02-03 DIAGNOSIS — M810 Age-related osteoporosis without current pathological fracture: Secondary | ICD-10-CM | POA: Diagnosis not present

## 2018-02-03 DIAGNOSIS — N189 Chronic kidney disease, unspecified: Secondary | ICD-10-CM | POA: Diagnosis not present

## 2018-02-03 DIAGNOSIS — E10319 Type 1 diabetes mellitus with unspecified diabetic retinopathy without macular edema: Secondary | ICD-10-CM | POA: Diagnosis not present

## 2018-02-03 DIAGNOSIS — E1129 Type 2 diabetes mellitus with other diabetic kidney complication: Secondary | ICD-10-CM | POA: Diagnosis not present

## 2018-03-10 DIAGNOSIS — H401131 Primary open-angle glaucoma, bilateral, mild stage: Secondary | ICD-10-CM | POA: Diagnosis not present

## 2018-04-07 DIAGNOSIS — Z9181 History of falling: Secondary | ICD-10-CM | POA: Diagnosis not present

## 2018-04-07 DIAGNOSIS — E785 Hyperlipidemia, unspecified: Secondary | ICD-10-CM | POA: Diagnosis not present

## 2018-04-07 DIAGNOSIS — Z125 Encounter for screening for malignant neoplasm of prostate: Secondary | ICD-10-CM | POA: Diagnosis not present

## 2018-04-07 DIAGNOSIS — Z0000 Encounter for general adult medical examination without abnormal findings: Secondary | ICD-10-CM | POA: Diagnosis not present

## 2018-04-07 DIAGNOSIS — Z1331 Encounter for screening for depression: Secondary | ICD-10-CM | POA: Diagnosis not present

## 2018-04-07 DIAGNOSIS — Z1211 Encounter for screening for malignant neoplasm of colon: Secondary | ICD-10-CM | POA: Diagnosis not present

## 2018-05-05 DIAGNOSIS — I10 Essential (primary) hypertension: Secondary | ICD-10-CM | POA: Diagnosis not present

## 2018-05-05 DIAGNOSIS — Z940 Kidney transplant status: Secondary | ICD-10-CM | POA: Diagnosis not present

## 2018-05-05 DIAGNOSIS — D899 Disorder involving the immune mechanism, unspecified: Secondary | ICD-10-CM | POA: Diagnosis not present

## 2018-05-05 DIAGNOSIS — D849 Immunodeficiency, unspecified: Secondary | ICD-10-CM

## 2018-05-05 HISTORY — DX: Immunodeficiency, unspecified: D84.9

## 2018-06-04 DIAGNOSIS — E10319 Type 1 diabetes mellitus with unspecified diabetic retinopathy without macular edema: Secondary | ICD-10-CM | POA: Diagnosis not present

## 2018-06-04 DIAGNOSIS — Z6829 Body mass index (BMI) 29.0-29.9, adult: Secondary | ICD-10-CM | POA: Diagnosis not present

## 2018-06-04 DIAGNOSIS — E1129 Type 2 diabetes mellitus with other diabetic kidney complication: Secondary | ICD-10-CM | POA: Diagnosis not present

## 2018-06-04 DIAGNOSIS — Z79899 Other long term (current) drug therapy: Secondary | ICD-10-CM | POA: Diagnosis not present

## 2018-06-04 DIAGNOSIS — Z125 Encounter for screening for malignant neoplasm of prostate: Secondary | ICD-10-CM | POA: Diagnosis not present

## 2018-06-04 DIAGNOSIS — E785 Hyperlipidemia, unspecified: Secondary | ICD-10-CM | POA: Diagnosis not present

## 2018-06-04 DIAGNOSIS — N189 Chronic kidney disease, unspecified: Secondary | ICD-10-CM | POA: Diagnosis not present

## 2018-07-10 DIAGNOSIS — H401131 Primary open-angle glaucoma, bilateral, mild stage: Secondary | ICD-10-CM | POA: Diagnosis not present

## 2018-07-31 DIAGNOSIS — H401131 Primary open-angle glaucoma, bilateral, mild stage: Secondary | ICD-10-CM | POA: Diagnosis not present

## 2018-10-09 DIAGNOSIS — E1129 Type 2 diabetes mellitus with other diabetic kidney complication: Secondary | ICD-10-CM | POA: Diagnosis not present

## 2018-10-09 DIAGNOSIS — N189 Chronic kidney disease, unspecified: Secondary | ICD-10-CM | POA: Diagnosis not present

## 2018-10-09 DIAGNOSIS — E785 Hyperlipidemia, unspecified: Secondary | ICD-10-CM | POA: Diagnosis not present

## 2018-10-09 DIAGNOSIS — E10319 Type 1 diabetes mellitus with unspecified diabetic retinopathy without macular edema: Secondary | ICD-10-CM | POA: Diagnosis not present

## 2018-10-09 DIAGNOSIS — Z23 Encounter for immunization: Secondary | ICD-10-CM | POA: Diagnosis not present

## 2018-10-09 DIAGNOSIS — Z79899 Other long term (current) drug therapy: Secondary | ICD-10-CM | POA: Diagnosis not present

## 2018-11-03 DIAGNOSIS — K219 Gastro-esophageal reflux disease without esophagitis: Secondary | ICD-10-CM | POA: Diagnosis not present

## 2018-11-10 DIAGNOSIS — Z940 Kidney transplant status: Secondary | ICD-10-CM | POA: Diagnosis not present

## 2018-11-10 DIAGNOSIS — I10 Essential (primary) hypertension: Secondary | ICD-10-CM | POA: Diagnosis not present

## 2018-11-10 DIAGNOSIS — Z9483 Pancreas transplant status: Secondary | ICD-10-CM | POA: Diagnosis not present

## 2018-11-14 DIAGNOSIS — H40051 Ocular hypertension, right eye: Secondary | ICD-10-CM | POA: Diagnosis not present

## 2018-11-27 DIAGNOSIS — Z7952 Long term (current) use of systemic steroids: Secondary | ICD-10-CM | POA: Diagnosis not present

## 2018-11-27 DIAGNOSIS — K648 Other hemorrhoids: Secondary | ICD-10-CM | POA: Diagnosis not present

## 2018-11-27 DIAGNOSIS — D122 Benign neoplasm of ascending colon: Secondary | ICD-10-CM | POA: Diagnosis not present

## 2018-11-27 DIAGNOSIS — D120 Benign neoplasm of cecum: Secondary | ICD-10-CM | POA: Diagnosis not present

## 2018-11-27 DIAGNOSIS — Z1211 Encounter for screening for malignant neoplasm of colon: Secondary | ICD-10-CM | POA: Diagnosis not present

## 2018-11-27 DIAGNOSIS — E1022 Type 1 diabetes mellitus with diabetic chronic kidney disease: Secondary | ICD-10-CM | POA: Diagnosis not present

## 2018-11-27 DIAGNOSIS — Z8711 Personal history of peptic ulcer disease: Secondary | ICD-10-CM | POA: Diagnosis not present

## 2018-11-27 DIAGNOSIS — I129 Hypertensive chronic kidney disease with stage 1 through stage 4 chronic kidney disease, or unspecified chronic kidney disease: Secondary | ICD-10-CM | POA: Diagnosis not present

## 2018-11-27 DIAGNOSIS — Z940 Kidney transplant status: Secondary | ICD-10-CM | POA: Diagnosis not present

## 2018-11-27 DIAGNOSIS — Z79899 Other long term (current) drug therapy: Secondary | ICD-10-CM | POA: Diagnosis not present

## 2018-11-27 DIAGNOSIS — D126 Benign neoplasm of colon, unspecified: Secondary | ICD-10-CM | POA: Diagnosis not present

## 2018-11-27 DIAGNOSIS — N189 Chronic kidney disease, unspecified: Secondary | ICD-10-CM | POA: Diagnosis not present

## 2018-11-27 DIAGNOSIS — E119 Type 2 diabetes mellitus without complications: Secondary | ICD-10-CM | POA: Diagnosis not present

## 2018-11-27 DIAGNOSIS — D631 Anemia in chronic kidney disease: Secondary | ICD-10-CM | POA: Diagnosis not present

## 2018-11-27 DIAGNOSIS — I10 Essential (primary) hypertension: Secondary | ICD-10-CM | POA: Diagnosis not present

## 2018-11-27 DIAGNOSIS — K644 Residual hemorrhoidal skin tags: Secondary | ICD-10-CM | POA: Diagnosis not present

## 2018-11-27 DIAGNOSIS — K635 Polyp of colon: Secondary | ICD-10-CM | POA: Diagnosis not present

## 2019-02-09 DIAGNOSIS — E785 Hyperlipidemia, unspecified: Secondary | ICD-10-CM | POA: Diagnosis not present

## 2019-02-09 DIAGNOSIS — Z79899 Other long term (current) drug therapy: Secondary | ICD-10-CM | POA: Diagnosis not present

## 2019-02-09 DIAGNOSIS — E1129 Type 2 diabetes mellitus with other diabetic kidney complication: Secondary | ICD-10-CM | POA: Diagnosis not present

## 2019-02-09 DIAGNOSIS — N189 Chronic kidney disease, unspecified: Secondary | ICD-10-CM | POA: Diagnosis not present

## 2019-02-09 DIAGNOSIS — E10319 Type 1 diabetes mellitus with unspecified diabetic retinopathy without macular edema: Secondary | ICD-10-CM | POA: Diagnosis not present

## 2019-04-09 DIAGNOSIS — Z9181 History of falling: Secondary | ICD-10-CM | POA: Diagnosis not present

## 2019-04-09 DIAGNOSIS — Z1331 Encounter for screening for depression: Secondary | ICD-10-CM | POA: Diagnosis not present

## 2019-04-09 DIAGNOSIS — Z136 Encounter for screening for cardiovascular disorders: Secondary | ICD-10-CM | POA: Diagnosis not present

## 2019-04-09 DIAGNOSIS — Z125 Encounter for screening for malignant neoplasm of prostate: Secondary | ICD-10-CM | POA: Diagnosis not present

## 2019-04-09 DIAGNOSIS — E785 Hyperlipidemia, unspecified: Secondary | ICD-10-CM | POA: Diagnosis not present

## 2019-04-09 DIAGNOSIS — Z1211 Encounter for screening for malignant neoplasm of colon: Secondary | ICD-10-CM | POA: Diagnosis not present

## 2019-04-09 DIAGNOSIS — Z0000 Encounter for general adult medical examination without abnormal findings: Secondary | ICD-10-CM | POA: Diagnosis not present

## 2019-05-06 DIAGNOSIS — E103591 Type 1 diabetes mellitus with proliferative diabetic retinopathy without macular edema, right eye: Secondary | ICD-10-CM | POA: Diagnosis not present

## 2019-06-01 DIAGNOSIS — Z79899 Other long term (current) drug therapy: Secondary | ICD-10-CM | POA: Diagnosis not present

## 2019-06-01 DIAGNOSIS — Z940 Kidney transplant status: Secondary | ICD-10-CM | POA: Diagnosis not present

## 2019-06-01 DIAGNOSIS — I10 Essential (primary) hypertension: Secondary | ICD-10-CM | POA: Diagnosis not present

## 2019-06-01 DIAGNOSIS — Z9483 Pancreas transplant status: Secondary | ICD-10-CM | POA: Diagnosis not present

## 2019-06-01 DIAGNOSIS — D899 Disorder involving the immune mechanism, unspecified: Secondary | ICD-10-CM | POA: Diagnosis not present

## 2019-06-01 DIAGNOSIS — Z48288 Encounter for aftercare following multiple organ transplant: Secondary | ICD-10-CM | POA: Diagnosis not present

## 2019-06-11 DIAGNOSIS — E1129 Type 2 diabetes mellitus with other diabetic kidney complication: Secondary | ICD-10-CM | POA: Diagnosis not present

## 2019-06-11 DIAGNOSIS — Z23 Encounter for immunization: Secondary | ICD-10-CM | POA: Diagnosis not present

## 2019-06-11 DIAGNOSIS — E10319 Type 1 diabetes mellitus with unspecified diabetic retinopathy without macular edema: Secondary | ICD-10-CM | POA: Diagnosis not present

## 2019-06-11 DIAGNOSIS — Z79899 Other long term (current) drug therapy: Secondary | ICD-10-CM | POA: Diagnosis not present

## 2019-06-11 DIAGNOSIS — Z125 Encounter for screening for malignant neoplasm of prostate: Secondary | ICD-10-CM | POA: Diagnosis not present

## 2019-06-11 DIAGNOSIS — E785 Hyperlipidemia, unspecified: Secondary | ICD-10-CM | POA: Diagnosis not present

## 2019-06-11 DIAGNOSIS — N189 Chronic kidney disease, unspecified: Secondary | ICD-10-CM | POA: Diagnosis not present

## 2019-06-11 DIAGNOSIS — R809 Proteinuria, unspecified: Secondary | ICD-10-CM | POA: Diagnosis not present

## 2019-06-11 DIAGNOSIS — S41102A Unspecified open wound of left upper arm, initial encounter: Secondary | ICD-10-CM | POA: Diagnosis not present

## 2019-10-09 DIAGNOSIS — Z139 Encounter for screening, unspecified: Secondary | ICD-10-CM | POA: Diagnosis not present

## 2019-10-09 DIAGNOSIS — Z23 Encounter for immunization: Secondary | ICD-10-CM | POA: Diagnosis not present

## 2019-10-09 DIAGNOSIS — E785 Hyperlipidemia, unspecified: Secondary | ICD-10-CM | POA: Diagnosis not present

## 2019-10-09 DIAGNOSIS — E1129 Type 2 diabetes mellitus with other diabetic kidney complication: Secondary | ICD-10-CM | POA: Diagnosis not present

## 2019-10-09 DIAGNOSIS — R809 Proteinuria, unspecified: Secondary | ICD-10-CM | POA: Diagnosis not present

## 2019-10-09 DIAGNOSIS — E10319 Type 1 diabetes mellitus with unspecified diabetic retinopathy without macular edema: Secondary | ICD-10-CM | POA: Diagnosis not present

## 2019-10-09 DIAGNOSIS — Z79899 Other long term (current) drug therapy: Secondary | ICD-10-CM | POA: Diagnosis not present

## 2019-10-09 DIAGNOSIS — N189 Chronic kidney disease, unspecified: Secondary | ICD-10-CM | POA: Diagnosis not present

## 2019-11-18 DIAGNOSIS — E103591 Type 1 diabetes mellitus with proliferative diabetic retinopathy without macular edema, right eye: Secondary | ICD-10-CM | POA: Diagnosis not present

## 2019-11-23 DIAGNOSIS — K219 Gastro-esophageal reflux disease without esophagitis: Secondary | ICD-10-CM | POA: Diagnosis not present

## 2019-11-23 DIAGNOSIS — K648 Other hemorrhoids: Secondary | ICD-10-CM | POA: Diagnosis not present

## 2019-12-07 DIAGNOSIS — I10 Essential (primary) hypertension: Secondary | ICD-10-CM | POA: Diagnosis not present

## 2019-12-07 DIAGNOSIS — D849 Immunodeficiency, unspecified: Secondary | ICD-10-CM | POA: Diagnosis not present

## 2019-12-07 DIAGNOSIS — Z940 Kidney transplant status: Secondary | ICD-10-CM | POA: Diagnosis not present

## 2020-01-25 DIAGNOSIS — U071 COVID-19: Secondary | ICD-10-CM | POA: Diagnosis not present

## 2020-01-25 DIAGNOSIS — R05 Cough: Secondary | ICD-10-CM | POA: Diagnosis not present

## 2020-02-05 DIAGNOSIS — I493 Ventricular premature depolarization: Secondary | ICD-10-CM | POA: Diagnosis not present

## 2020-02-05 DIAGNOSIS — Z794 Long term (current) use of insulin: Secondary | ICD-10-CM | POA: Diagnosis not present

## 2020-02-05 DIAGNOSIS — M8580 Other specified disorders of bone density and structure, unspecified site: Secondary | ICD-10-CM | POA: Diagnosis present

## 2020-02-05 DIAGNOSIS — U071 COVID-19: Secondary | ICD-10-CM | POA: Diagnosis not present

## 2020-02-05 DIAGNOSIS — Z9483 Pancreas transplant status: Secondary | ICD-10-CM | POA: Diagnosis not present

## 2020-02-05 DIAGNOSIS — E1022 Type 1 diabetes mellitus with diabetic chronic kidney disease: Secondary | ICD-10-CM | POA: Diagnosis present

## 2020-02-05 DIAGNOSIS — D849 Immunodeficiency, unspecified: Secondary | ICD-10-CM | POA: Diagnosis present

## 2020-02-05 DIAGNOSIS — R079 Chest pain, unspecified: Secondary | ICD-10-CM | POA: Diagnosis not present

## 2020-02-05 DIAGNOSIS — I2510 Atherosclerotic heart disease of native coronary artery without angina pectoris: Secondary | ICD-10-CM | POA: Diagnosis present

## 2020-02-05 DIAGNOSIS — Z87891 Personal history of nicotine dependence: Secondary | ICD-10-CM | POA: Diagnosis not present

## 2020-02-05 DIAGNOSIS — J1282 Pneumonia due to coronavirus disease 2019: Secondary | ICD-10-CM | POA: Diagnosis not present

## 2020-02-05 DIAGNOSIS — I10 Essential (primary) hypertension: Secondary | ICD-10-CM | POA: Diagnosis not present

## 2020-02-05 DIAGNOSIS — R0602 Shortness of breath: Secondary | ICD-10-CM | POA: Diagnosis not present

## 2020-02-05 DIAGNOSIS — Z961 Presence of intraocular lens: Secondary | ICD-10-CM | POA: Diagnosis present

## 2020-02-05 DIAGNOSIS — N183 Chronic kidney disease, stage 3 unspecified: Secondary | ICD-10-CM | POA: Diagnosis present

## 2020-02-05 DIAGNOSIS — R0902 Hypoxemia: Secondary | ICD-10-CM | POA: Diagnosis not present

## 2020-02-05 DIAGNOSIS — R918 Other nonspecific abnormal finding of lung field: Secondary | ICD-10-CM | POA: Diagnosis not present

## 2020-02-05 DIAGNOSIS — H548 Legal blindness, as defined in USA: Secondary | ICD-10-CM | POA: Diagnosis present

## 2020-02-05 DIAGNOSIS — I129 Hypertensive chronic kidney disease with stage 1 through stage 4 chronic kidney disease, or unspecified chronic kidney disease: Secondary | ICD-10-CM | POA: Diagnosis not present

## 2020-02-05 DIAGNOSIS — Z940 Kidney transplant status: Secondary | ICD-10-CM | POA: Diagnosis not present

## 2020-02-05 DIAGNOSIS — Z789 Other specified health status: Secondary | ICD-10-CM | POA: Diagnosis not present

## 2020-02-05 DIAGNOSIS — Z79899 Other long term (current) drug therapy: Secondary | ICD-10-CM | POA: Diagnosis not present

## 2020-02-05 HISTORY — DX: COVID-19: U07.1

## 2020-02-08 DIAGNOSIS — I129 Hypertensive chronic kidney disease with stage 1 through stage 4 chronic kidney disease, or unspecified chronic kidney disease: Secondary | ICD-10-CM | POA: Diagnosis not present

## 2020-02-08 DIAGNOSIS — Z940 Kidney transplant status: Secondary | ICD-10-CM | POA: Diagnosis not present

## 2020-02-08 DIAGNOSIS — Z9483 Pancreas transplant status: Secondary | ICD-10-CM | POA: Diagnosis not present

## 2020-02-08 DIAGNOSIS — U071 COVID-19: Secondary | ICD-10-CM | POA: Diagnosis not present

## 2020-02-10 DIAGNOSIS — C641 Malignant neoplasm of right kidney, except renal pelvis: Secondary | ICD-10-CM | POA: Diagnosis not present

## 2020-02-10 DIAGNOSIS — U071 COVID-19: Secondary | ICD-10-CM | POA: Diagnosis not present

## 2020-02-10 DIAGNOSIS — J9601 Acute respiratory failure with hypoxia: Secondary | ICD-10-CM | POA: Diagnosis not present

## 2020-02-10 DIAGNOSIS — J1282 Pneumonia due to coronavirus disease 2019: Secondary | ICD-10-CM | POA: Diagnosis not present

## 2020-02-10 DIAGNOSIS — R160 Hepatomegaly, not elsewhere classified: Secondary | ICD-10-CM | POA: Diagnosis not present

## 2020-02-10 DIAGNOSIS — T8619 Other complication of kidney transplant: Secondary | ICD-10-CM | POA: Diagnosis not present

## 2020-02-10 DIAGNOSIS — I10 Essential (primary) hypertension: Secondary | ICD-10-CM | POA: Diagnosis not present

## 2020-02-10 DIAGNOSIS — R0602 Shortness of breath: Secondary | ICD-10-CM | POA: Diagnosis not present

## 2020-02-10 DIAGNOSIS — R0902 Hypoxemia: Secondary | ICD-10-CM | POA: Diagnosis not present

## 2020-02-10 DIAGNOSIS — D849 Immunodeficiency, unspecified: Secondary | ICD-10-CM | POA: Diagnosis not present

## 2020-02-10 DIAGNOSIS — N1831 Chronic kidney disease, stage 3a: Secondary | ICD-10-CM | POA: Diagnosis not present

## 2020-02-10 DIAGNOSIS — Z9483 Pancreas transplant status: Secondary | ICD-10-CM | POA: Diagnosis not present

## 2020-02-10 DIAGNOSIS — J189 Pneumonia, unspecified organism: Secondary | ICD-10-CM | POA: Diagnosis not present

## 2020-02-10 DIAGNOSIS — Z940 Kidney transplant status: Secondary | ICD-10-CM | POA: Diagnosis not present

## 2020-02-10 DIAGNOSIS — R05 Cough: Secondary | ICD-10-CM | POA: Diagnosis not present

## 2020-02-10 DIAGNOSIS — R509 Fever, unspecified: Secondary | ICD-10-CM | POA: Diagnosis not present

## 2020-02-10 DIAGNOSIS — I2699 Other pulmonary embolism without acute cor pulmonale: Secondary | ICD-10-CM | POA: Diagnosis not present

## 2020-02-10 DIAGNOSIS — N2889 Other specified disorders of kidney and ureter: Secondary | ICD-10-CM | POA: Diagnosis not present

## 2020-02-11 DIAGNOSIS — I130 Hypertensive heart and chronic kidney disease with heart failure and stage 1 through stage 4 chronic kidney disease, or unspecified chronic kidney disease: Secondary | ICD-10-CM | POA: Diagnosis present

## 2020-02-11 DIAGNOSIS — I10 Essential (primary) hypertension: Secondary | ICD-10-CM | POA: Diagnosis not present

## 2020-02-11 DIAGNOSIS — U071 COVID-19: Secondary | ICD-10-CM | POA: Diagnosis present

## 2020-02-11 DIAGNOSIS — H548 Legal blindness, as defined in USA: Secondary | ICD-10-CM | POA: Diagnosis present

## 2020-02-11 DIAGNOSIS — R918 Other nonspecific abnormal finding of lung field: Secondary | ICD-10-CM | POA: Diagnosis not present

## 2020-02-11 DIAGNOSIS — N179 Acute kidney failure, unspecified: Secondary | ICD-10-CM | POA: Diagnosis present

## 2020-02-11 DIAGNOSIS — I5033 Acute on chronic diastolic (congestive) heart failure: Secondary | ICD-10-CM | POA: Diagnosis not present

## 2020-02-11 DIAGNOSIS — J9601 Acute respiratory failure with hypoxia: Secondary | ICD-10-CM | POA: Diagnosis not present

## 2020-02-11 DIAGNOSIS — E10311 Type 1 diabetes mellitus with unspecified diabetic retinopathy with macular edema: Secondary | ICD-10-CM | POA: Diagnosis present

## 2020-02-11 DIAGNOSIS — N289 Disorder of kidney and ureter, unspecified: Secondary | ICD-10-CM | POA: Diagnosis not present

## 2020-02-11 DIAGNOSIS — R197 Diarrhea, unspecified: Secondary | ICD-10-CM | POA: Diagnosis present

## 2020-02-11 DIAGNOSIS — J1282 Pneumonia due to coronavirus disease 2019: Secondary | ICD-10-CM

## 2020-02-11 DIAGNOSIS — I82611 Acute embolism and thrombosis of superficial veins of right upper extremity: Secondary | ICD-10-CM | POA: Diagnosis not present

## 2020-02-11 DIAGNOSIS — I2699 Other pulmonary embolism without acute cor pulmonale: Secondary | ICD-10-CM

## 2020-02-11 DIAGNOSIS — Z7901 Long term (current) use of anticoagulants: Secondary | ICD-10-CM | POA: Diagnosis not present

## 2020-02-11 DIAGNOSIS — T8619 Other complication of kidney transplant: Secondary | ICD-10-CM

## 2020-02-11 DIAGNOSIS — Z794 Long term (current) use of insulin: Secondary | ICD-10-CM | POA: Diagnosis not present

## 2020-02-11 DIAGNOSIS — R609 Edema, unspecified: Secondary | ICD-10-CM | POA: Diagnosis not present

## 2020-02-11 DIAGNOSIS — E1022 Type 1 diabetes mellitus with diabetic chronic kidney disease: Secondary | ICD-10-CM | POA: Diagnosis present

## 2020-02-11 DIAGNOSIS — M8580 Other specified disorders of bone density and structure, unspecified site: Secondary | ICD-10-CM | POA: Diagnosis present

## 2020-02-11 DIAGNOSIS — I5031 Acute diastolic (congestive) heart failure: Secondary | ICD-10-CM | POA: Diagnosis not present

## 2020-02-11 DIAGNOSIS — N2889 Other specified disorders of kidney and ureter: Secondary | ICD-10-CM

## 2020-02-11 DIAGNOSIS — J90 Pleural effusion, not elsewhere classified: Secondary | ICD-10-CM | POA: Diagnosis not present

## 2020-02-11 DIAGNOSIS — Z940 Kidney transplant status: Secondary | ICD-10-CM | POA: Diagnosis not present

## 2020-02-11 DIAGNOSIS — Z9483 Pancreas transplant status: Secondary | ICD-10-CM | POA: Diagnosis not present

## 2020-02-11 DIAGNOSIS — I2510 Atherosclerotic heart disease of native coronary artery without angina pectoris: Secondary | ICD-10-CM | POA: Diagnosis present

## 2020-02-11 DIAGNOSIS — D689 Coagulation defect, unspecified: Secondary | ICD-10-CM | POA: Diagnosis present

## 2020-02-11 DIAGNOSIS — E785 Hyperlipidemia, unspecified: Secondary | ICD-10-CM | POA: Diagnosis present

## 2020-02-11 DIAGNOSIS — I38 Endocarditis, valve unspecified: Secondary | ICD-10-CM | POA: Diagnosis not present

## 2020-02-11 DIAGNOSIS — D849 Immunodeficiency, unspecified: Secondary | ICD-10-CM | POA: Diagnosis present

## 2020-02-11 DIAGNOSIS — R160 Hepatomegaly, not elsewhere classified: Secondary | ICD-10-CM

## 2020-02-11 DIAGNOSIS — N1831 Chronic kidney disease, stage 3a: Secondary | ICD-10-CM

## 2020-02-11 DIAGNOSIS — R7881 Bacteremia: Secondary | ICD-10-CM | POA: Diagnosis present

## 2020-02-11 HISTORY — DX: Other complication of kidney transplant: T86.19

## 2020-02-11 HISTORY — DX: Other pulmonary embolism without acute cor pulmonale: I26.99

## 2020-02-11 HISTORY — DX: Hepatomegaly, not elsewhere classified: R16.0

## 2020-02-11 HISTORY — DX: Chronic kidney disease, stage 3a: N18.31

## 2020-02-11 HISTORY — DX: Other specified disorders of kidney and ureter: N28.89

## 2020-02-11 HISTORY — DX: Pneumonia due to coronavirus disease 2019: J12.82

## 2020-02-13 DIAGNOSIS — D689 Coagulation defect, unspecified: Secondary | ICD-10-CM

## 2020-02-13 DIAGNOSIS — J9601 Acute respiratory failure with hypoxia: Secondary | ICD-10-CM

## 2020-02-13 DIAGNOSIS — H548 Legal blindness, as defined in USA: Secondary | ICD-10-CM

## 2020-02-13 HISTORY — DX: Coagulation defect, unspecified: D68.9

## 2020-02-13 HISTORY — DX: Legal blindness, as defined in USA: H54.8

## 2020-02-13 HISTORY — DX: Acute respiratory failure with hypoxia: J96.01

## 2020-02-20 DIAGNOSIS — I2510 Atherosclerotic heart disease of native coronary artery without angina pectoris: Secondary | ICD-10-CM | POA: Diagnosis not present

## 2020-02-20 DIAGNOSIS — R160 Hepatomegaly, not elsewhere classified: Secondary | ICD-10-CM | POA: Diagnosis not present

## 2020-02-20 DIAGNOSIS — E10311 Type 1 diabetes mellitus with unspecified diabetic retinopathy with macular edema: Secondary | ICD-10-CM | POA: Diagnosis not present

## 2020-02-20 DIAGNOSIS — Z79899 Other long term (current) drug therapy: Secondary | ICD-10-CM | POA: Diagnosis not present

## 2020-02-20 DIAGNOSIS — I129 Hypertensive chronic kidney disease with stage 1 through stage 4 chronic kidney disease, or unspecified chronic kidney disease: Secondary | ICD-10-CM | POA: Diagnosis not present

## 2020-02-20 DIAGNOSIS — N1831 Chronic kidney disease, stage 3a: Secondary | ICD-10-CM | POA: Diagnosis not present

## 2020-02-20 DIAGNOSIS — U071 COVID-19: Secondary | ICD-10-CM | POA: Diagnosis not present

## 2020-02-20 DIAGNOSIS — E1021 Type 1 diabetes mellitus with diabetic nephropathy: Secondary | ICD-10-CM | POA: Diagnosis not present

## 2020-02-20 DIAGNOSIS — I2693 Single subsegmental pulmonary embolism without acute cor pulmonale: Secondary | ICD-10-CM | POA: Diagnosis not present

## 2020-02-20 DIAGNOSIS — D8489 Other immunodeficiencies: Secondary | ICD-10-CM | POA: Diagnosis not present

## 2020-02-20 DIAGNOSIS — Z940 Kidney transplant status: Secondary | ICD-10-CM | POA: Diagnosis not present

## 2020-02-20 DIAGNOSIS — E785 Hyperlipidemia, unspecified: Secondary | ICD-10-CM | POA: Diagnosis not present

## 2020-02-20 DIAGNOSIS — J1281 Pneumonia due to SARS-associated coronavirus: Secondary | ICD-10-CM | POA: Diagnosis not present

## 2020-02-20 DIAGNOSIS — Z87891 Personal history of nicotine dependence: Secondary | ICD-10-CM | POA: Diagnosis not present

## 2020-02-20 DIAGNOSIS — E1022 Type 1 diabetes mellitus with diabetic chronic kidney disease: Secondary | ICD-10-CM | POA: Diagnosis not present

## 2020-02-20 DIAGNOSIS — Z20822 Contact with and (suspected) exposure to covid-19: Secondary | ICD-10-CM | POA: Diagnosis not present

## 2020-02-20 DIAGNOSIS — N2889 Other specified disorders of kidney and ureter: Secondary | ICD-10-CM | POA: Diagnosis not present

## 2020-02-20 DIAGNOSIS — Z9483 Pancreas transplant status: Secondary | ICD-10-CM | POA: Diagnosis not present

## 2020-02-21 DIAGNOSIS — U071 COVID-19: Secondary | ICD-10-CM | POA: Diagnosis not present

## 2020-02-21 DIAGNOSIS — I129 Hypertensive chronic kidney disease with stage 1 through stage 4 chronic kidney disease, or unspecified chronic kidney disease: Secondary | ICD-10-CM | POA: Diagnosis not present

## 2020-02-21 DIAGNOSIS — N1831 Chronic kidney disease, stage 3a: Secondary | ICD-10-CM | POA: Diagnosis not present

## 2020-02-21 DIAGNOSIS — I2693 Single subsegmental pulmonary embolism without acute cor pulmonale: Secondary | ICD-10-CM | POA: Diagnosis not present

## 2020-02-21 DIAGNOSIS — J1281 Pneumonia due to SARS-associated coronavirus: Secondary | ICD-10-CM | POA: Diagnosis not present

## 2020-02-21 DIAGNOSIS — I2510 Atherosclerotic heart disease of native coronary artery without angina pectoris: Secondary | ICD-10-CM | POA: Diagnosis not present

## 2020-02-22 DIAGNOSIS — I2693 Single subsegmental pulmonary embolism without acute cor pulmonale: Secondary | ICD-10-CM | POA: Diagnosis not present

## 2020-02-22 DIAGNOSIS — U071 COVID-19: Secondary | ICD-10-CM | POA: Diagnosis not present

## 2020-02-22 DIAGNOSIS — I2510 Atherosclerotic heart disease of native coronary artery without angina pectoris: Secondary | ICD-10-CM | POA: Diagnosis not present

## 2020-02-22 DIAGNOSIS — J1281 Pneumonia due to SARS-associated coronavirus: Secondary | ICD-10-CM | POA: Diagnosis not present

## 2020-02-22 DIAGNOSIS — I129 Hypertensive chronic kidney disease with stage 1 through stage 4 chronic kidney disease, or unspecified chronic kidney disease: Secondary | ICD-10-CM | POA: Diagnosis not present

## 2020-02-22 DIAGNOSIS — N1831 Chronic kidney disease, stage 3a: Secondary | ICD-10-CM | POA: Diagnosis not present

## 2020-02-23 DIAGNOSIS — I2510 Atherosclerotic heart disease of native coronary artery without angina pectoris: Secondary | ICD-10-CM | POA: Diagnosis not present

## 2020-02-23 DIAGNOSIS — N1831 Chronic kidney disease, stage 3a: Secondary | ICD-10-CM | POA: Diagnosis not present

## 2020-02-23 DIAGNOSIS — J1281 Pneumonia due to SARS-associated coronavirus: Secondary | ICD-10-CM | POA: Diagnosis not present

## 2020-02-23 DIAGNOSIS — I129 Hypertensive chronic kidney disease with stage 1 through stage 4 chronic kidney disease, or unspecified chronic kidney disease: Secondary | ICD-10-CM | POA: Diagnosis not present

## 2020-02-23 DIAGNOSIS — I2693 Single subsegmental pulmonary embolism without acute cor pulmonale: Secondary | ICD-10-CM | POA: Diagnosis not present

## 2020-02-23 DIAGNOSIS — U071 COVID-19: Secondary | ICD-10-CM | POA: Diagnosis not present

## 2020-02-24 DIAGNOSIS — I2699 Other pulmonary embolism without acute cor pulmonale: Secondary | ICD-10-CM | POA: Diagnosis not present

## 2020-02-24 DIAGNOSIS — N183 Chronic kidney disease, stage 3 unspecified: Secondary | ICD-10-CM | POA: Diagnosis not present

## 2020-02-24 DIAGNOSIS — U071 COVID-19: Secondary | ICD-10-CM | POA: Diagnosis not present

## 2020-02-24 DIAGNOSIS — D849 Immunodeficiency, unspecified: Secondary | ICD-10-CM | POA: Diagnosis not present

## 2020-02-25 DIAGNOSIS — I2510 Atherosclerotic heart disease of native coronary artery without angina pectoris: Secondary | ICD-10-CM | POA: Diagnosis not present

## 2020-02-25 DIAGNOSIS — N1831 Chronic kidney disease, stage 3a: Secondary | ICD-10-CM | POA: Diagnosis not present

## 2020-02-25 DIAGNOSIS — I129 Hypertensive chronic kidney disease with stage 1 through stage 4 chronic kidney disease, or unspecified chronic kidney disease: Secondary | ICD-10-CM | POA: Diagnosis not present

## 2020-02-25 DIAGNOSIS — I2693 Single subsegmental pulmonary embolism without acute cor pulmonale: Secondary | ICD-10-CM | POA: Diagnosis not present

## 2020-02-25 DIAGNOSIS — J1281 Pneumonia due to SARS-associated coronavirus: Secondary | ICD-10-CM | POA: Diagnosis not present

## 2020-02-25 DIAGNOSIS — U071 COVID-19: Secondary | ICD-10-CM | POA: Diagnosis not present

## 2020-03-02 DIAGNOSIS — J1281 Pneumonia due to SARS-associated coronavirus: Secondary | ICD-10-CM | POA: Diagnosis not present

## 2020-03-02 DIAGNOSIS — I129 Hypertensive chronic kidney disease with stage 1 through stage 4 chronic kidney disease, or unspecified chronic kidney disease: Secondary | ICD-10-CM | POA: Diagnosis not present

## 2020-03-02 DIAGNOSIS — I2693 Single subsegmental pulmonary embolism without acute cor pulmonale: Secondary | ICD-10-CM | POA: Diagnosis not present

## 2020-03-02 DIAGNOSIS — I2510 Atherosclerotic heart disease of native coronary artery without angina pectoris: Secondary | ICD-10-CM | POA: Diagnosis not present

## 2020-03-02 DIAGNOSIS — U071 COVID-19: Secondary | ICD-10-CM | POA: Diagnosis not present

## 2020-03-02 DIAGNOSIS — N1831 Chronic kidney disease, stage 3a: Secondary | ICD-10-CM | POA: Diagnosis not present

## 2020-03-04 DIAGNOSIS — U071 COVID-19: Secondary | ICD-10-CM | POA: Diagnosis not present

## 2020-03-04 DIAGNOSIS — I2693 Single subsegmental pulmonary embolism without acute cor pulmonale: Secondary | ICD-10-CM | POA: Diagnosis not present

## 2020-03-04 DIAGNOSIS — I129 Hypertensive chronic kidney disease with stage 1 through stage 4 chronic kidney disease, or unspecified chronic kidney disease: Secondary | ICD-10-CM | POA: Diagnosis not present

## 2020-03-04 DIAGNOSIS — J1281 Pneumonia due to SARS-associated coronavirus: Secondary | ICD-10-CM | POA: Diagnosis not present

## 2020-03-04 DIAGNOSIS — I2510 Atherosclerotic heart disease of native coronary artery without angina pectoris: Secondary | ICD-10-CM | POA: Diagnosis not present

## 2020-03-04 DIAGNOSIS — N1831 Chronic kidney disease, stage 3a: Secondary | ICD-10-CM | POA: Diagnosis not present

## 2020-03-10 DIAGNOSIS — U071 COVID-19: Secondary | ICD-10-CM | POA: Diagnosis not present

## 2020-03-10 DIAGNOSIS — I2510 Atherosclerotic heart disease of native coronary artery without angina pectoris: Secondary | ICD-10-CM | POA: Diagnosis not present

## 2020-03-10 DIAGNOSIS — I2693 Single subsegmental pulmonary embolism without acute cor pulmonale: Secondary | ICD-10-CM | POA: Diagnosis not present

## 2020-03-10 DIAGNOSIS — N1831 Chronic kidney disease, stage 3a: Secondary | ICD-10-CM | POA: Diagnosis not present

## 2020-03-10 DIAGNOSIS — J1281 Pneumonia due to SARS-associated coronavirus: Secondary | ICD-10-CM | POA: Diagnosis not present

## 2020-03-10 DIAGNOSIS — I129 Hypertensive chronic kidney disease with stage 1 through stage 4 chronic kidney disease, or unspecified chronic kidney disease: Secondary | ICD-10-CM | POA: Diagnosis not present

## 2020-03-15 DIAGNOSIS — J1281 Pneumonia due to SARS-associated coronavirus: Secondary | ICD-10-CM | POA: Diagnosis not present

## 2020-03-15 DIAGNOSIS — I2510 Atherosclerotic heart disease of native coronary artery without angina pectoris: Secondary | ICD-10-CM | POA: Diagnosis not present

## 2020-03-15 DIAGNOSIS — I129 Hypertensive chronic kidney disease with stage 1 through stage 4 chronic kidney disease, or unspecified chronic kidney disease: Secondary | ICD-10-CM | POA: Diagnosis not present

## 2020-03-15 DIAGNOSIS — U071 COVID-19: Secondary | ICD-10-CM | POA: Diagnosis not present

## 2020-03-15 DIAGNOSIS — I2693 Single subsegmental pulmonary embolism without acute cor pulmonale: Secondary | ICD-10-CM | POA: Diagnosis not present

## 2020-03-15 DIAGNOSIS — N1831 Chronic kidney disease, stage 3a: Secondary | ICD-10-CM | POA: Diagnosis not present

## 2020-03-21 DIAGNOSIS — D8489 Other immunodeficiencies: Secondary | ICD-10-CM | POA: Diagnosis not present

## 2020-03-21 DIAGNOSIS — J1281 Pneumonia due to SARS-associated coronavirus: Secondary | ICD-10-CM | POA: Diagnosis not present

## 2020-03-21 DIAGNOSIS — U071 COVID-19: Secondary | ICD-10-CM | POA: Diagnosis not present

## 2020-03-21 DIAGNOSIS — Z940 Kidney transplant status: Secondary | ICD-10-CM | POA: Diagnosis not present

## 2020-03-21 DIAGNOSIS — E1021 Type 1 diabetes mellitus with diabetic nephropathy: Secondary | ICD-10-CM | POA: Diagnosis not present

## 2020-03-21 DIAGNOSIS — E10311 Type 1 diabetes mellitus with unspecified diabetic retinopathy with macular edema: Secondary | ICD-10-CM | POA: Diagnosis not present

## 2020-03-21 DIAGNOSIS — J189 Pneumonia, unspecified organism: Secondary | ICD-10-CM | POA: Diagnosis not present

## 2020-03-21 DIAGNOSIS — Z9483 Pancreas transplant status: Secondary | ICD-10-CM | POA: Diagnosis not present

## 2020-03-21 DIAGNOSIS — Z79899 Other long term (current) drug therapy: Secondary | ICD-10-CM | POA: Diagnosis not present

## 2020-03-21 DIAGNOSIS — E785 Hyperlipidemia, unspecified: Secondary | ICD-10-CM | POA: Diagnosis not present

## 2020-03-21 DIAGNOSIS — N1831 Chronic kidney disease, stage 3a: Secondary | ICD-10-CM | POA: Diagnosis not present

## 2020-03-21 DIAGNOSIS — I2510 Atherosclerotic heart disease of native coronary artery without angina pectoris: Secondary | ICD-10-CM | POA: Diagnosis not present

## 2020-03-21 DIAGNOSIS — Z87891 Personal history of nicotine dependence: Secondary | ICD-10-CM | POA: Diagnosis not present

## 2020-03-21 DIAGNOSIS — R160 Hepatomegaly, not elsewhere classified: Secondary | ICD-10-CM | POA: Diagnosis not present

## 2020-03-21 DIAGNOSIS — I2693 Single subsegmental pulmonary embolism without acute cor pulmonale: Secondary | ICD-10-CM | POA: Diagnosis not present

## 2020-03-21 DIAGNOSIS — I129 Hypertensive chronic kidney disease with stage 1 through stage 4 chronic kidney disease, or unspecified chronic kidney disease: Secondary | ICD-10-CM | POA: Diagnosis not present

## 2020-03-21 DIAGNOSIS — E1022 Type 1 diabetes mellitus with diabetic chronic kidney disease: Secondary | ICD-10-CM | POA: Diagnosis not present

## 2020-03-21 DIAGNOSIS — N2889 Other specified disorders of kidney and ureter: Secondary | ICD-10-CM | POA: Diagnosis not present

## 2020-03-22 DIAGNOSIS — N2889 Other specified disorders of kidney and ureter: Secondary | ICD-10-CM | POA: Diagnosis not present

## 2020-03-22 DIAGNOSIS — Z87891 Personal history of nicotine dependence: Secondary | ICD-10-CM | POA: Diagnosis not present

## 2020-03-22 DIAGNOSIS — R160 Hepatomegaly, not elsewhere classified: Secondary | ICD-10-CM | POA: Diagnosis not present

## 2020-03-22 DIAGNOSIS — J189 Pneumonia, unspecified organism: Secondary | ICD-10-CM | POA: Diagnosis not present

## 2020-03-22 DIAGNOSIS — Z79899 Other long term (current) drug therapy: Secondary | ICD-10-CM | POA: Diagnosis not present

## 2020-03-22 DIAGNOSIS — I2510 Atherosclerotic heart disease of native coronary artery without angina pectoris: Secondary | ICD-10-CM | POA: Diagnosis not present

## 2020-03-24 DIAGNOSIS — D899 Disorder involving the immune mechanism, unspecified: Secondary | ICD-10-CM | POA: Diagnosis present

## 2020-03-24 DIAGNOSIS — I340 Nonrheumatic mitral (valve) insufficiency: Secondary | ICD-10-CM | POA: Diagnosis not present

## 2020-03-24 DIAGNOSIS — J9691 Respiratory failure, unspecified with hypoxia: Secondary | ICD-10-CM | POA: Diagnosis not present

## 2020-03-24 DIAGNOSIS — J1289 Other viral pneumonia: Secondary | ICD-10-CM | POA: Diagnosis not present

## 2020-03-24 DIAGNOSIS — Z9483 Pancreas transplant status: Secondary | ICD-10-CM | POA: Diagnosis not present

## 2020-03-24 DIAGNOSIS — U071 COVID-19: Secondary | ICD-10-CM | POA: Diagnosis not present

## 2020-03-24 DIAGNOSIS — R0602 Shortness of breath: Secondary | ICD-10-CM | POA: Diagnosis not present

## 2020-03-24 DIAGNOSIS — J189 Pneumonia, unspecified organism: Secondary | ICD-10-CM | POA: Diagnosis not present

## 2020-03-24 DIAGNOSIS — I2510 Atherosclerotic heart disease of native coronary artery without angina pectoris: Secondary | ICD-10-CM | POA: Diagnosis present

## 2020-03-24 DIAGNOSIS — Z940 Kidney transplant status: Secondary | ICD-10-CM | POA: Diagnosis not present

## 2020-03-24 DIAGNOSIS — E7800 Pure hypercholesterolemia, unspecified: Secondary | ICD-10-CM | POA: Diagnosis present

## 2020-03-24 DIAGNOSIS — J9601 Acute respiratory failure with hypoxia: Secondary | ICD-10-CM | POA: Diagnosis not present

## 2020-03-24 DIAGNOSIS — R509 Fever, unspecified: Secondary | ICD-10-CM | POA: Diagnosis not present

## 2020-03-24 DIAGNOSIS — D638 Anemia in other chronic diseases classified elsewhere: Secondary | ICD-10-CM | POA: Diagnosis present

## 2020-03-24 DIAGNOSIS — E871 Hypo-osmolality and hyponatremia: Secondary | ICD-10-CM | POA: Diagnosis present

## 2020-03-24 DIAGNOSIS — N179 Acute kidney failure, unspecified: Secondary | ICD-10-CM | POA: Diagnosis not present

## 2020-03-24 DIAGNOSIS — Z79899 Other long term (current) drug therapy: Secondary | ICD-10-CM | POA: Diagnosis not present

## 2020-03-24 DIAGNOSIS — Z8616 Personal history of COVID-19: Secondary | ICD-10-CM | POA: Diagnosis not present

## 2020-03-24 DIAGNOSIS — M8949 Other hypertrophic osteoarthropathy, multiple sites: Secondary | ICD-10-CM | POA: Diagnosis present

## 2020-03-24 DIAGNOSIS — Z881 Allergy status to other antibiotic agents status: Secondary | ICD-10-CM | POA: Diagnosis not present

## 2020-03-24 DIAGNOSIS — M7989 Other specified soft tissue disorders: Secondary | ICD-10-CM | POA: Diagnosis not present

## 2020-03-24 DIAGNOSIS — I110 Hypertensive heart disease with heart failure: Secondary | ICD-10-CM | POA: Diagnosis present

## 2020-03-24 DIAGNOSIS — Z7952 Long term (current) use of systemic steroids: Secondary | ICD-10-CM | POA: Diagnosis not present

## 2020-03-24 DIAGNOSIS — I5033 Acute on chronic diastolic (congestive) heart failure: Secondary | ICD-10-CM | POA: Diagnosis not present

## 2020-03-24 DIAGNOSIS — Z8701 Personal history of pneumonia (recurrent): Secondary | ICD-10-CM | POA: Diagnosis not present

## 2020-03-25 DIAGNOSIS — J189 Pneumonia, unspecified organism: Secondary | ICD-10-CM | POA: Diagnosis not present

## 2020-03-25 DIAGNOSIS — D899 Disorder involving the immune mechanism, unspecified: Secondary | ICD-10-CM | POA: Diagnosis not present

## 2020-03-25 DIAGNOSIS — J9691 Respiratory failure, unspecified with hypoxia: Secondary | ICD-10-CM | POA: Diagnosis not present

## 2020-03-26 DIAGNOSIS — I340 Nonrheumatic mitral (valve) insufficiency: Secondary | ICD-10-CM | POA: Diagnosis not present

## 2020-03-26 DIAGNOSIS — D899 Disorder involving the immune mechanism, unspecified: Secondary | ICD-10-CM | POA: Diagnosis not present

## 2020-03-26 DIAGNOSIS — J189 Pneumonia, unspecified organism: Secondary | ICD-10-CM | POA: Diagnosis not present

## 2020-03-26 DIAGNOSIS — J9691 Respiratory failure, unspecified with hypoxia: Secondary | ICD-10-CM | POA: Diagnosis not present

## 2020-03-27 DIAGNOSIS — J189 Pneumonia, unspecified organism: Secondary | ICD-10-CM | POA: Diagnosis not present

## 2020-03-27 DIAGNOSIS — J9691 Respiratory failure, unspecified with hypoxia: Secondary | ICD-10-CM | POA: Diagnosis not present

## 2020-03-27 DIAGNOSIS — D899 Disorder involving the immune mechanism, unspecified: Secondary | ICD-10-CM | POA: Diagnosis not present

## 2020-03-28 DIAGNOSIS — J9691 Respiratory failure, unspecified with hypoxia: Secondary | ICD-10-CM | POA: Diagnosis not present

## 2020-03-28 DIAGNOSIS — J189 Pneumonia, unspecified organism: Secondary | ICD-10-CM | POA: Diagnosis not present

## 2020-03-28 DIAGNOSIS — D899 Disorder involving the immune mechanism, unspecified: Secondary | ICD-10-CM | POA: Diagnosis not present

## 2020-03-28 DIAGNOSIS — J9601 Acute respiratory failure with hypoxia: Secondary | ICD-10-CM | POA: Diagnosis not present

## 2020-03-29 DIAGNOSIS — J9691 Respiratory failure, unspecified with hypoxia: Secondary | ICD-10-CM | POA: Diagnosis not present

## 2020-03-29 DIAGNOSIS — J189 Pneumonia, unspecified organism: Secondary | ICD-10-CM | POA: Diagnosis not present

## 2020-03-29 DIAGNOSIS — D899 Disorder involving the immune mechanism, unspecified: Secondary | ICD-10-CM | POA: Diagnosis not present

## 2020-03-30 DIAGNOSIS — J9691 Respiratory failure, unspecified with hypoxia: Secondary | ICD-10-CM | POA: Diagnosis not present

## 2020-03-30 DIAGNOSIS — D899 Disorder involving the immune mechanism, unspecified: Secondary | ICD-10-CM | POA: Diagnosis not present

## 2020-03-30 DIAGNOSIS — J189 Pneumonia, unspecified organism: Secondary | ICD-10-CM | POA: Diagnosis not present

## 2020-03-31 DIAGNOSIS — J9691 Respiratory failure, unspecified with hypoxia: Secondary | ICD-10-CM | POA: Diagnosis not present

## 2020-03-31 DIAGNOSIS — J189 Pneumonia, unspecified organism: Secondary | ICD-10-CM | POA: Diagnosis not present

## 2020-03-31 DIAGNOSIS — D899 Disorder involving the immune mechanism, unspecified: Secondary | ICD-10-CM | POA: Diagnosis not present

## 2020-04-01 DIAGNOSIS — J9691 Respiratory failure, unspecified with hypoxia: Secondary | ICD-10-CM | POA: Diagnosis not present

## 2020-04-01 DIAGNOSIS — J189 Pneumonia, unspecified organism: Secondary | ICD-10-CM | POA: Diagnosis not present

## 2020-04-01 DIAGNOSIS — D899 Disorder involving the immune mechanism, unspecified: Secondary | ICD-10-CM | POA: Diagnosis not present

## 2020-04-02 DIAGNOSIS — J9691 Respiratory failure, unspecified with hypoxia: Secondary | ICD-10-CM | POA: Diagnosis not present

## 2020-04-02 DIAGNOSIS — D899 Disorder involving the immune mechanism, unspecified: Secondary | ICD-10-CM | POA: Diagnosis not present

## 2020-04-02 DIAGNOSIS — J189 Pneumonia, unspecified organism: Secondary | ICD-10-CM | POA: Diagnosis not present

## 2020-04-04 DIAGNOSIS — I2510 Atherosclerotic heart disease of native coronary artery without angina pectoris: Secondary | ICD-10-CM | POA: Diagnosis not present

## 2020-04-04 DIAGNOSIS — R160 Hepatomegaly, not elsewhere classified: Secondary | ICD-10-CM | POA: Diagnosis not present

## 2020-04-04 DIAGNOSIS — N2889 Other specified disorders of kidney and ureter: Secondary | ICD-10-CM | POA: Diagnosis not present

## 2020-04-04 DIAGNOSIS — J189 Pneumonia, unspecified organism: Secondary | ICD-10-CM | POA: Diagnosis not present

## 2020-04-04 DIAGNOSIS — Z79899 Other long term (current) drug therapy: Secondary | ICD-10-CM | POA: Diagnosis not present

## 2020-04-04 DIAGNOSIS — Z87891 Personal history of nicotine dependence: Secondary | ICD-10-CM | POA: Diagnosis not present

## 2020-04-05 DIAGNOSIS — Z79899 Other long term (current) drug therapy: Secondary | ICD-10-CM | POA: Diagnosis not present

## 2020-04-05 DIAGNOSIS — R160 Hepatomegaly, not elsewhere classified: Secondary | ICD-10-CM | POA: Diagnosis not present

## 2020-04-05 DIAGNOSIS — I2510 Atherosclerotic heart disease of native coronary artery without angina pectoris: Secondary | ICD-10-CM | POA: Diagnosis not present

## 2020-04-05 DIAGNOSIS — J189 Pneumonia, unspecified organism: Secondary | ICD-10-CM | POA: Diagnosis not present

## 2020-04-05 DIAGNOSIS — N2889 Other specified disorders of kidney and ureter: Secondary | ICD-10-CM | POA: Diagnosis not present

## 2020-04-05 DIAGNOSIS — Z87891 Personal history of nicotine dependence: Secondary | ICD-10-CM | POA: Diagnosis not present

## 2020-04-06 DIAGNOSIS — J9601 Acute respiratory failure with hypoxia: Secondary | ICD-10-CM | POA: Diagnosis not present

## 2020-04-06 DIAGNOSIS — I5031 Acute diastolic (congestive) heart failure: Secondary | ICD-10-CM | POA: Diagnosis not present

## 2020-04-06 DIAGNOSIS — J189 Pneumonia, unspecified organism: Secondary | ICD-10-CM | POA: Diagnosis not present

## 2020-04-06 DIAGNOSIS — N179 Acute kidney failure, unspecified: Secondary | ICD-10-CM | POA: Diagnosis not present

## 2020-04-07 DIAGNOSIS — Z79899 Other long term (current) drug therapy: Secondary | ICD-10-CM | POA: Diagnosis not present

## 2020-04-07 DIAGNOSIS — N2889 Other specified disorders of kidney and ureter: Secondary | ICD-10-CM | POA: Diagnosis not present

## 2020-04-07 DIAGNOSIS — Z87891 Personal history of nicotine dependence: Secondary | ICD-10-CM | POA: Diagnosis not present

## 2020-04-07 DIAGNOSIS — R160 Hepatomegaly, not elsewhere classified: Secondary | ICD-10-CM | POA: Diagnosis not present

## 2020-04-07 DIAGNOSIS — I2510 Atherosclerotic heart disease of native coronary artery without angina pectoris: Secondary | ICD-10-CM | POA: Diagnosis not present

## 2020-04-07 DIAGNOSIS — J189 Pneumonia, unspecified organism: Secondary | ICD-10-CM | POA: Diagnosis not present

## 2020-04-08 DIAGNOSIS — I130 Hypertensive heart and chronic kidney disease with heart failure and stage 1 through stage 4 chronic kidney disease, or unspecified chronic kidney disease: Secondary | ICD-10-CM | POA: Diagnosis not present

## 2020-04-08 DIAGNOSIS — I129 Hypertensive chronic kidney disease with stage 1 through stage 4 chronic kidney disease, or unspecified chronic kidney disease: Secondary | ICD-10-CM | POA: Diagnosis not present

## 2020-04-08 DIAGNOSIS — Z881 Allergy status to other antibiotic agents status: Secondary | ICD-10-CM | POA: Diagnosis not present

## 2020-04-08 DIAGNOSIS — J9611 Chronic respiratory failure with hypoxia: Secondary | ICD-10-CM | POA: Diagnosis not present

## 2020-04-08 DIAGNOSIS — Z9483 Pancreas transplant status: Secondary | ICD-10-CM | POA: Diagnosis not present

## 2020-04-08 DIAGNOSIS — Z792 Long term (current) use of antibiotics: Secondary | ICD-10-CM | POA: Diagnosis not present

## 2020-04-08 DIAGNOSIS — Z87891 Personal history of nicotine dependence: Secondary | ICD-10-CM | POA: Diagnosis not present

## 2020-04-08 DIAGNOSIS — Z9981 Dependence on supplemental oxygen: Secondary | ICD-10-CM | POA: Diagnosis not present

## 2020-04-08 DIAGNOSIS — M1990 Unspecified osteoarthritis, unspecified site: Secondary | ICD-10-CM | POA: Diagnosis present

## 2020-04-08 DIAGNOSIS — Z8616 Personal history of COVID-19: Secondary | ICD-10-CM | POA: Diagnosis not present

## 2020-04-08 DIAGNOSIS — J9601 Acute respiratory failure with hypoxia: Secondary | ICD-10-CM | POA: Diagnosis not present

## 2020-04-08 DIAGNOSIS — I498 Other specified cardiac arrhythmias: Secondary | ICD-10-CM | POA: Diagnosis not present

## 2020-04-08 DIAGNOSIS — U071 COVID-19: Secondary | ICD-10-CM | POA: Diagnosis not present

## 2020-04-08 DIAGNOSIS — R001 Bradycardia, unspecified: Secondary | ICD-10-CM | POA: Diagnosis present

## 2020-04-08 DIAGNOSIS — R0902 Hypoxemia: Secondary | ICD-10-CM | POA: Diagnosis not present

## 2020-04-08 DIAGNOSIS — E876 Hypokalemia: Secondary | ICD-10-CM | POA: Diagnosis present

## 2020-04-08 DIAGNOSIS — M8949 Other hypertrophic osteoarthropathy, multiple sites: Secondary | ICD-10-CM | POA: Diagnosis present

## 2020-04-08 DIAGNOSIS — N1831 Chronic kidney disease, stage 3a: Secondary | ICD-10-CM | POA: Diagnosis present

## 2020-04-08 DIAGNOSIS — Z8701 Personal history of pneumonia (recurrent): Secondary | ICD-10-CM | POA: Diagnosis not present

## 2020-04-08 DIAGNOSIS — N189 Chronic kidney disease, unspecified: Secondary | ICD-10-CM | POA: Diagnosis not present

## 2020-04-08 DIAGNOSIS — J8489 Other specified interstitial pulmonary diseases: Secondary | ICD-10-CM | POA: Diagnosis not present

## 2020-04-08 DIAGNOSIS — R846 Abnormal cytological findings in specimens from respiratory organs and thorax: Secondary | ICD-10-CM | POA: Diagnosis not present

## 2020-04-08 DIAGNOSIS — Z7952 Long term (current) use of systemic steroids: Secondary | ICD-10-CM | POA: Diagnosis not present

## 2020-04-08 DIAGNOSIS — I2510 Atherosclerotic heart disease of native coronary artery without angina pectoris: Secondary | ICD-10-CM | POA: Diagnosis present

## 2020-04-08 DIAGNOSIS — E7800 Pure hypercholesterolemia, unspecified: Secondary | ICD-10-CM | POA: Diagnosis present

## 2020-04-08 DIAGNOSIS — I509 Heart failure, unspecified: Secondary | ICD-10-CM | POA: Diagnosis not present

## 2020-04-08 DIAGNOSIS — Z79899 Other long term (current) drug therapy: Secondary | ICD-10-CM | POA: Diagnosis not present

## 2020-04-08 DIAGNOSIS — R918 Other nonspecific abnormal finding of lung field: Secondary | ICD-10-CM | POA: Diagnosis not present

## 2020-04-08 DIAGNOSIS — D899 Disorder involving the immune mechanism, unspecified: Secondary | ICD-10-CM | POA: Diagnosis present

## 2020-04-08 DIAGNOSIS — N183 Chronic kidney disease, stage 3 unspecified: Secondary | ICD-10-CM | POA: Diagnosis not present

## 2020-04-08 DIAGNOSIS — R7989 Other specified abnormal findings of blood chemistry: Secondary | ICD-10-CM | POA: Diagnosis not present

## 2020-04-08 DIAGNOSIS — J189 Pneumonia, unspecified organism: Secondary | ICD-10-CM | POA: Diagnosis not present

## 2020-04-08 DIAGNOSIS — J90 Pleural effusion, not elsewhere classified: Secondary | ICD-10-CM | POA: Diagnosis not present

## 2020-04-08 DIAGNOSIS — R911 Solitary pulmonary nodule: Secondary | ICD-10-CM | POA: Diagnosis present

## 2020-04-08 DIAGNOSIS — Z940 Kidney transplant status: Secondary | ICD-10-CM | POA: Diagnosis not present

## 2020-04-13 ENCOUNTER — Telehealth: Payer: Self-pay | Admitting: Cardiology

## 2020-04-13 NOTE — Telephone Encounter (Signed)
Patient was told to f/u with cardiology pending discharge from [facility]. He is scheduled for 04/21/20 with Dr. Geraldo Pitter.

## 2020-04-14 DIAGNOSIS — R160 Hepatomegaly, not elsewhere classified: Secondary | ICD-10-CM | POA: Diagnosis not present

## 2020-04-14 DIAGNOSIS — I2510 Atherosclerotic heart disease of native coronary artery without angina pectoris: Secondary | ICD-10-CM | POA: Diagnosis not present

## 2020-04-14 DIAGNOSIS — J189 Pneumonia, unspecified organism: Secondary | ICD-10-CM | POA: Diagnosis not present

## 2020-04-14 DIAGNOSIS — N2889 Other specified disorders of kidney and ureter: Secondary | ICD-10-CM | POA: Diagnosis not present

## 2020-04-14 DIAGNOSIS — Z87891 Personal history of nicotine dependence: Secondary | ICD-10-CM | POA: Diagnosis not present

## 2020-04-14 DIAGNOSIS — Z79899 Other long term (current) drug therapy: Secondary | ICD-10-CM | POA: Diagnosis not present

## 2020-04-15 DIAGNOSIS — N2889 Other specified disorders of kidney and ureter: Secondary | ICD-10-CM | POA: Diagnosis not present

## 2020-04-15 DIAGNOSIS — Z79899 Other long term (current) drug therapy: Secondary | ICD-10-CM | POA: Diagnosis not present

## 2020-04-15 DIAGNOSIS — R160 Hepatomegaly, not elsewhere classified: Secondary | ICD-10-CM | POA: Diagnosis not present

## 2020-04-15 DIAGNOSIS — J189 Pneumonia, unspecified organism: Secondary | ICD-10-CM | POA: Diagnosis not present

## 2020-04-15 DIAGNOSIS — I2510 Atherosclerotic heart disease of native coronary artery without angina pectoris: Secondary | ICD-10-CM | POA: Diagnosis not present

## 2020-04-15 DIAGNOSIS — Z87891 Personal history of nicotine dependence: Secondary | ICD-10-CM | POA: Diagnosis not present

## 2020-04-17 DIAGNOSIS — R160 Hepatomegaly, not elsewhere classified: Secondary | ICD-10-CM | POA: Diagnosis not present

## 2020-04-17 DIAGNOSIS — N2889 Other specified disorders of kidney and ureter: Secondary | ICD-10-CM | POA: Diagnosis not present

## 2020-04-17 DIAGNOSIS — Z79899 Other long term (current) drug therapy: Secondary | ICD-10-CM | POA: Diagnosis not present

## 2020-04-17 DIAGNOSIS — Z87891 Personal history of nicotine dependence: Secondary | ICD-10-CM | POA: Diagnosis not present

## 2020-04-17 DIAGNOSIS — I2510 Atherosclerotic heart disease of native coronary artery without angina pectoris: Secondary | ICD-10-CM | POA: Diagnosis not present

## 2020-04-17 DIAGNOSIS — J189 Pneumonia, unspecified organism: Secondary | ICD-10-CM | POA: Diagnosis not present

## 2020-04-18 DIAGNOSIS — Z79899 Other long term (current) drug therapy: Secondary | ICD-10-CM | POA: Diagnosis not present

## 2020-04-18 DIAGNOSIS — J9611 Chronic respiratory failure with hypoxia: Secondary | ICD-10-CM | POA: Diagnosis not present

## 2020-04-18 DIAGNOSIS — I2510 Atherosclerotic heart disease of native coronary artery without angina pectoris: Secondary | ICD-10-CM | POA: Diagnosis not present

## 2020-04-18 DIAGNOSIS — N2889 Other specified disorders of kidney and ureter: Secondary | ICD-10-CM | POA: Diagnosis not present

## 2020-04-18 DIAGNOSIS — Z87891 Personal history of nicotine dependence: Secondary | ICD-10-CM | POA: Diagnosis not present

## 2020-04-18 DIAGNOSIS — R160 Hepatomegaly, not elsewhere classified: Secondary | ICD-10-CM | POA: Diagnosis not present

## 2020-04-18 DIAGNOSIS — J189 Pneumonia, unspecified organism: Secondary | ICD-10-CM | POA: Diagnosis not present

## 2020-04-18 DIAGNOSIS — J84112 Idiopathic pulmonary fibrosis: Secondary | ICD-10-CM | POA: Diagnosis not present

## 2020-04-19 ENCOUNTER — Other Ambulatory Visit: Payer: Self-pay

## 2020-04-19 DIAGNOSIS — J9611 Chronic respiratory failure with hypoxia: Secondary | ICD-10-CM | POA: Diagnosis not present

## 2020-04-19 DIAGNOSIS — J189 Pneumonia, unspecified organism: Secondary | ICD-10-CM | POA: Diagnosis not present

## 2020-04-19 DIAGNOSIS — Z9181 History of falling: Secondary | ICD-10-CM | POA: Diagnosis not present

## 2020-04-19 DIAGNOSIS — Z1331 Encounter for screening for depression: Secondary | ICD-10-CM | POA: Diagnosis not present

## 2020-04-20 DIAGNOSIS — I5033 Acute on chronic diastolic (congestive) heart failure: Secondary | ICD-10-CM | POA: Diagnosis not present

## 2020-04-20 DIAGNOSIS — Z87891 Personal history of nicotine dependence: Secondary | ICD-10-CM | POA: Diagnosis not present

## 2020-04-20 DIAGNOSIS — Z79899 Other long term (current) drug therapy: Secondary | ICD-10-CM | POA: Diagnosis not present

## 2020-04-20 DIAGNOSIS — I129 Hypertensive chronic kidney disease with stage 1 through stage 4 chronic kidney disease, or unspecified chronic kidney disease: Secondary | ICD-10-CM | POA: Diagnosis not present

## 2020-04-20 DIAGNOSIS — D631 Anemia in chronic kidney disease: Secondary | ICD-10-CM | POA: Diagnosis not present

## 2020-04-20 DIAGNOSIS — I2510 Atherosclerotic heart disease of native coronary artery without angina pectoris: Secondary | ICD-10-CM | POA: Diagnosis not present

## 2020-04-20 DIAGNOSIS — Z8616 Personal history of COVID-19: Secondary | ICD-10-CM | POA: Diagnosis not present

## 2020-04-20 DIAGNOSIS — N2889 Other specified disorders of kidney and ureter: Secondary | ICD-10-CM | POA: Diagnosis not present

## 2020-04-20 DIAGNOSIS — Z79891 Long term (current) use of opiate analgesic: Secondary | ICD-10-CM | POA: Diagnosis not present

## 2020-04-20 DIAGNOSIS — Z791 Long term (current) use of non-steroidal anti-inflammatories (NSAID): Secondary | ICD-10-CM | POA: Diagnosis not present

## 2020-04-20 DIAGNOSIS — Z940 Kidney transplant status: Secondary | ICD-10-CM | POA: Diagnosis not present

## 2020-04-20 DIAGNOSIS — I700 Atherosclerosis of aorta: Secondary | ICD-10-CM | POA: Diagnosis not present

## 2020-04-20 DIAGNOSIS — Z9483 Pancreas transplant status: Secondary | ICD-10-CM | POA: Diagnosis not present

## 2020-04-20 DIAGNOSIS — E7800 Pure hypercholesterolemia, unspecified: Secondary | ICD-10-CM | POA: Diagnosis not present

## 2020-04-20 DIAGNOSIS — R160 Hepatomegaly, not elsewhere classified: Secondary | ICD-10-CM | POA: Diagnosis not present

## 2020-04-20 DIAGNOSIS — M8949 Other hypertrophic osteoarthropathy, multiple sites: Secondary | ICD-10-CM | POA: Diagnosis not present

## 2020-04-20 DIAGNOSIS — Z86711 Personal history of pulmonary embolism: Secondary | ICD-10-CM | POA: Diagnosis not present

## 2020-04-20 DIAGNOSIS — Z9981 Dependence on supplemental oxygen: Secondary | ICD-10-CM | POA: Diagnosis not present

## 2020-04-20 DIAGNOSIS — J9611 Chronic respiratory failure with hypoxia: Secondary | ICD-10-CM | POA: Diagnosis not present

## 2020-04-20 DIAGNOSIS — Z7952 Long term (current) use of systemic steroids: Secondary | ICD-10-CM | POA: Diagnosis not present

## 2020-04-20 DIAGNOSIS — D849 Immunodeficiency, unspecified: Secondary | ICD-10-CM | POA: Diagnosis not present

## 2020-04-20 DIAGNOSIS — J189 Pneumonia, unspecified organism: Secondary | ICD-10-CM | POA: Diagnosis not present

## 2020-04-20 DIAGNOSIS — N183 Chronic kidney disease, stage 3 unspecified: Secondary | ICD-10-CM | POA: Diagnosis not present

## 2020-04-20 DIAGNOSIS — R911 Solitary pulmonary nodule: Secondary | ICD-10-CM | POA: Diagnosis not present

## 2020-04-21 ENCOUNTER — Ambulatory Visit (INDEPENDENT_AMBULATORY_CARE_PROVIDER_SITE_OTHER): Payer: Medicare Other | Admitting: Cardiology

## 2020-04-21 ENCOUNTER — Ambulatory Visit (INDEPENDENT_AMBULATORY_CARE_PROVIDER_SITE_OTHER): Payer: Medicare Other

## 2020-04-21 ENCOUNTER — Other Ambulatory Visit: Payer: Self-pay

## 2020-04-21 ENCOUNTER — Encounter: Payer: Self-pay | Admitting: Cardiology

## 2020-04-21 VITALS — BP 124/82 | HR 71 | Temp 97.2°F | Ht 66.0 in | Wt 160.2 lb

## 2020-04-21 DIAGNOSIS — I700 Atherosclerosis of aorta: Secondary | ICD-10-CM | POA: Insufficient documentation

## 2020-04-21 DIAGNOSIS — R001 Bradycardia, unspecified: Secondary | ICD-10-CM

## 2020-04-21 DIAGNOSIS — Z940 Kidney transplant status: Secondary | ICD-10-CM

## 2020-04-21 DIAGNOSIS — I7091 Generalized atherosclerosis: Secondary | ICD-10-CM

## 2020-04-21 HISTORY — DX: Atherosclerosis of aorta: I70.0

## 2020-04-21 HISTORY — DX: Bradycardia, unspecified: R00.1

## 2020-04-21 NOTE — Patient Instructions (Signed)
Medication Instructions:  No medication changes *If you need a refill on your cardiac medications before your next appointment, please call your pharmacy*   Lab Work: None ordered If you have labs (blood work) drawn today and your tests are completely normal, you will receive your results only by: Marland Kitchen MyChart Message (if you have MyChart) OR . A paper copy in the mail If you have any lab test that is abnormal or we need to change your treatment, we will call you to review the results.   Testing/Procedures:  WHY IS MY DOCTOR PRESCRIBING ZIO? The Zio system is proven and trusted by physicians to detect and diagnose irregular heart rhythms -- and has been prescribed to hundreds of thousands of patients.  The FDA has cleared the Zio system to monitor for many different kinds of irregular heart rhythms. In a study, physicians were able to reach a diagnosis 90% of the time with the Zio system1.  You can wear the Zio monitor -- a small, discreet, comfortable patch -- during your normal day-to-day activity, including while you sleep, shower, and exercise, while it records every single heartbeat for analysis.  1Barrett, P., et al. Comparison of 24 Hour Holter Monitoring Versus 14 Day Novel Adhesive Patch Electrocardiographic Monitoring. Butler, 2014.  ZIO VS. HOLTER MONITORING The Zio monitor can be comfortably worn for up to 14 days. Holter monitors can be worn for 24 to 48 hours, limiting the time to record any irregular heart rhythms you may have. Zio is able to capture data for the 51% of patients who have their first symptom-triggered arrhythmia after 48 hours.1  LIVE WITHOUT RESTRICTIONS The Zio ambulatory cardiac monitor is a small, unobtrusive, and water-resistant patch--you might even forget you're wearing it. The Zio monitor records and stores every beat of your heart, whether you're sleeping, working out, or showering. Wear for 3 days and remove on 04/24/20 after  3:00.   Follow-Up: At [facility], you and your health needs are our priority.  As part of our continuing mission to provide you with exceptional heart care, we have created designated Provider Care Teams.  These Care Teams include your primary Cardiologist (physician) and Advanced Practice Providers (APPs -  Physician Assistants and Nurse Practitioners) who all work together to provide you with the care you need, when you need it.  We recommend signing up for the patient portal called "MyChart".  Sign up information is provided on this After Visit Summary.  MyChart is used to connect with patients for Virtual Visits (Telemedicine).  Patients are able to view lab/test results, encounter notes, upcoming appointments, etc.  Non-urgent messages can be sent to your provider as well.   To learn more about what you can do with MyChart, go to [email or web address].    Your next appointment:   3 month(s)  The format for your next appointment:   In Person  Provider:   Jyl Heinz, MD   Other Instructions NA

## 2020-04-21 NOTE — Progress Notes (Signed)
Cardiology Office Note:    Date:  04/21/2020   ID:  Lucas Dunn, DOB 1946-01-28, MRN [id number]  PCP:  Nicoletta Dress, MD  Cardiologist:  Jenean Lindau, MD   Referring MD: No ref. provider found    ASSESSMENT:    1. S/P kidney transplant   2. Aortic atherosclerosis ([place])   3. Bradycardia    PLAN:    In order of problems listed above:  1. Bradycardia: I reviewed my records available to me I reviewed his vital signs from today and the report of most concern.  His TSH done in February was within normal limits.  His echocardiogram also was unremarkable at [facility] with preserved systolic function.  In view of this I will do a 3-day ZIO monitoring to assess for any significant bradycardia. 2. Aortic atherosclerosis: Managed by primary care physician.  Patient is on statin therapy and this is followed by his primary care doctor. 3. Post Covid pneumonia: Improving and followed by primary care and pulmonologist. 4. Patient will be seen in follow-up appointment in 6 months or earlier if the patient has any concerns    Medication Adjustments/Labs and Tests Ordered: Current medicines are reviewed at length with the patient today.  Concerns regarding medicines are outlined above.  No orders of the defined types were placed in this encounter.  No orders of the defined types were placed in this encounter.    History of Present Illness:    Lucas Dunn is a 74 y.o. male who is being seen today for the evaluation of bradycardia at the request of No ref. provider found.  Patient is a pleasant 74 year old male.  He has past medical history of kidney and pancreatic transplant.  He went to [facility] for Covid pneumonitis.  Subsequently has been on oxygen.  He denies any chest pain orthopnea or PND.  He is followed by pulmonologist also.  He was found to be bradycardic and therefore referred here.  He denies history of dizziness palpitations or any syncope.  At the time of my  evaluation, the patient is alert awake oriented and in no distress.  Past Medical History:  Diagnosis Date  . Acute respiratory failure with hypoxia ([place]) 02/13/2020  . Amblyopia of left eye 10/10/2012  . 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 CAD (coronary artery disease) 10/10/2012  . Chronic pain of right hip 10/15/2014  . Coagulopathy ([place]) 02/13/2020  . COVID-19 virus detected 02/05/2020  . 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 Essential hypertension 10/10/2012  . Immunosuppression ([place]) 05/05/2018  . Left renal mass 02/11/2020  . Liver mass, right lobe 02/11/2020  . Nuclear sclerotic cataract of left eye 10/10/2012  . Osteopenia 10/10/2012   Formatting of this note might be different from the original. A. Last BMD 09/2004.  Marland Kitchen PDR (proliferative diabetic retinopathy) ([place]) 10/10/2012   Formatting of this note might be different from the original. S/P  PPV  OD  . Pneumonia due to COVID-19 virus 02/11/2020  . Pseudophakia of right eye 10/10/2012  . Pulmonary embolism associated with COVID-19 ([place]) 02/11/2020  . Renal mass of kidney transplant 02/11/2020  . Right carotid bruit 10/10/2012  . S/P kidney transplant 09/08/2000   Formatting of this note might be different from the original. A. 1B, 1DR match.    B. Kidney cold ischemia time 18 hours.    C. 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   D. Zenapax induction; 09/13/00 discharge creatinine 3.2.  E. 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  F. Negative renal transplant artery arteriogram 6/04.  . Stage 3a chronic kidney disease 02/11/2020  . Type 1 diabetes mellitus ([place]) 10/14/2012   Formatting of this note might be different from the original. A. 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 B. Retinopathy C. Mild neuropathy.    History reviewed. No pertinent surgical history.  Current Medications: Current Meds  Medication Sig  . atorvastatin (LIPITOR) 20 MG tablet Take 20 mg by mouth daily.  . Calcium Carbonate-Vitamin D 600-200 MG-UNIT TABS Take 1 tablet by  mouth 2 (two) times daily with a meal.  . furosemide (LASIX) 20 MG tablet Take 20 mg by mouth 2 (two) times daily.  Marland Kitchen latanoprost (XALATAN) 0.005 % ophthalmic solution 1 drop daily.  . mycophenolate (CELLCEPT) 250 MG capsule Take 500 mg by mouth 2 (two) times daily.  . tacrolimus (PROGRAF) 0.5 MG capsule   . traMADol (ULTRAM) 50 MG tablet Take 50 mg by mouth as needed for pain.     Allergies:   Latex and Other   Social History   Socioeconomic History  . Marital status: Married    Spouse name: Not on file  . Number of children: Not on file  . Years of education: Not on file  . Highest education level: Not on file  Occupational History  . Not on file  Tobacco Use  . Smoking status: Former Research scientist (life sciences)  . Smokeless tobacco: Never Used  Substance and Sexual Activity  . Alcohol use: Not on file  . Drug use: Not on file  . Sexual activity: Not on file  Other Topics Concern  . Not on file  Social History Narrative  . Not on file   Social Determinants of Health   Financial Resource Strain:   . Difficulty of Paying Living Expenses:   Food Insecurity:   . Worried About Charity fundraiser in the Last Year:   . Arboriculturist in the Last Year:   Transportation Needs:   . Film/video editor (Medical):   Marland Kitchen Lack of Transportation (Non-Medical):   Physical Activity:   . Days of Exercise per Week:   . Minutes of Exercise per Session:   Stress:   . Feeling of Stress :   Social Connections:   . Frequency of Communication with Friends and Family:   . Frequency of Social Gatherings with Friends and Family:   . Attends Religious Services:   . Active Member of Clubs or Organizations:   . Attends Archivist Meetings:   Marland Kitchen Marital Status:      Family History: The patient's family history is not on file.  ROS:   Please see the history of present illness.    All other systems reviewed and are negative.  EKGs/Labs/Other Studies Reviewed:    The following studies were  reviewed today: I discussed my findings with the patient at extensive length EKG reveals sinus rhythm and nonspecific ST-T changes and mild LVH.   Recent Labs: No results found for requested labs within last 8760 hours.  Recent Lipid Panel No results found for: CHOL, TRIG, HDL, CHOLHDL, VLDL, LDLCALC, LDLDIRECT  Physical Exam:    VS:  BP 124/82   Pulse 71   Temp (!) 97.2 F (36.2 C)   Ht 5\' 6"  (1.676 m)   Wt 160 lb 3.2 oz (72.7 kg)   SpO2 99%   BMI 25.86 kg/m     Wt Readings from Last 3 Encounters:  04/21/20 160 lb 3.2 oz (72.7 kg)  GEN: Patient is in no acute distress HEENT: Normal NECK: No JVD; No carotid bruits LYMPHATICS: No lymphadenopathy CARDIAC: S1 S2 regular, 2/6 systolic murmur at the apex. RESPIRATORY:  Clear to auscultation without rales, wheezing or rhonchi  ABDOMEN: Soft, non-tender, non-distended MUSCULOSKELETAL:  No edema; No deformity  SKIN: Warm and dry NEUROLOGIC:  Alert and oriented x 3 PSYCHIATRIC:  Normal affect    Signed, Jenean Lindau, MD  04/21/2020 1:59 PM    [facility]

## 2020-04-22 DIAGNOSIS — I129 Hypertensive chronic kidney disease with stage 1 through stage 4 chronic kidney disease, or unspecified chronic kidney disease: Secondary | ICD-10-CM | POA: Diagnosis not present

## 2020-04-22 DIAGNOSIS — I5033 Acute on chronic diastolic (congestive) heart failure: Secondary | ICD-10-CM | POA: Diagnosis not present

## 2020-04-22 DIAGNOSIS — N183 Chronic kidney disease, stage 3 unspecified: Secondary | ICD-10-CM | POA: Diagnosis not present

## 2020-04-22 DIAGNOSIS — J189 Pneumonia, unspecified organism: Secondary | ICD-10-CM | POA: Diagnosis not present

## 2020-04-22 DIAGNOSIS — J9611 Chronic respiratory failure with hypoxia: Secondary | ICD-10-CM | POA: Diagnosis not present

## 2020-04-25 DIAGNOSIS — J9611 Chronic respiratory failure with hypoxia: Secondary | ICD-10-CM | POA: Diagnosis not present

## 2020-04-25 DIAGNOSIS — J189 Pneumonia, unspecified organism: Secondary | ICD-10-CM | POA: Diagnosis not present

## 2020-04-25 DIAGNOSIS — I129 Hypertensive chronic kidney disease with stage 1 through stage 4 chronic kidney disease, or unspecified chronic kidney disease: Secondary | ICD-10-CM | POA: Diagnosis not present

## 2020-04-25 DIAGNOSIS — I5033 Acute on chronic diastolic (congestive) heart failure: Secondary | ICD-10-CM | POA: Diagnosis not present

## 2020-04-25 DIAGNOSIS — N183 Chronic kidney disease, stage 3 unspecified: Secondary | ICD-10-CM | POA: Diagnosis not present

## 2020-04-28 DIAGNOSIS — J189 Pneumonia, unspecified organism: Secondary | ICD-10-CM | POA: Diagnosis not present

## 2020-04-28 DIAGNOSIS — I129 Hypertensive chronic kidney disease with stage 1 through stage 4 chronic kidney disease, or unspecified chronic kidney disease: Secondary | ICD-10-CM | POA: Diagnosis not present

## 2020-04-28 DIAGNOSIS — N183 Chronic kidney disease, stage 3 unspecified: Secondary | ICD-10-CM | POA: Diagnosis not present

## 2020-04-28 DIAGNOSIS — J9611 Chronic respiratory failure with hypoxia: Secondary | ICD-10-CM | POA: Diagnosis not present

## 2020-04-28 DIAGNOSIS — I5033 Acute on chronic diastolic (congestive) heart failure: Secondary | ICD-10-CM | POA: Diagnosis not present

## 2020-05-02 DIAGNOSIS — I129 Hypertensive chronic kidney disease with stage 1 through stage 4 chronic kidney disease, or unspecified chronic kidney disease: Secondary | ICD-10-CM | POA: Diagnosis not present

## 2020-05-02 DIAGNOSIS — N183 Chronic kidney disease, stage 3 unspecified: Secondary | ICD-10-CM | POA: Diagnosis not present

## 2020-05-02 DIAGNOSIS — J189 Pneumonia, unspecified organism: Secondary | ICD-10-CM | POA: Diagnosis not present

## 2020-05-02 DIAGNOSIS — J9611 Chronic respiratory failure with hypoxia: Secondary | ICD-10-CM | POA: Diagnosis not present

## 2020-05-02 DIAGNOSIS — I5033 Acute on chronic diastolic (congestive) heart failure: Secondary | ICD-10-CM | POA: Diagnosis not present

## 2020-05-05 DIAGNOSIS — J189 Pneumonia, unspecified organism: Secondary | ICD-10-CM | POA: Diagnosis not present

## 2020-05-05 DIAGNOSIS — J9611 Chronic respiratory failure with hypoxia: Secondary | ICD-10-CM | POA: Diagnosis not present

## 2020-05-05 DIAGNOSIS — I129 Hypertensive chronic kidney disease with stage 1 through stage 4 chronic kidney disease, or unspecified chronic kidney disease: Secondary | ICD-10-CM | POA: Diagnosis not present

## 2020-05-05 DIAGNOSIS — N183 Chronic kidney disease, stage 3 unspecified: Secondary | ICD-10-CM | POA: Diagnosis not present

## 2020-05-05 DIAGNOSIS — I5033 Acute on chronic diastolic (congestive) heart failure: Secondary | ICD-10-CM | POA: Diagnosis not present

## 2020-05-06 ENCOUNTER — Telehealth: Payer: Self-pay | Admitting: Cardiology

## 2020-05-06 NOTE — Telephone Encounter (Signed)
FYI

## 2020-05-06 NOTE — Telephone Encounter (Signed)
STAT if HR is under 50 or over 120 (normal HR is 60-100 beats per minute)  1) What is your heart rate? 43  2) Do you have a log of your heart rate readings (document readings)?   05-13: 43-50   05-10: 47-60  05-06: 55 3) Do you have any other symptoms? No  [facility] called and wanted to report these HR readings. The [facility] notes that the HR dropped after ambulation but went back up to normal ranges with rest. He is also on continuous oxygen and his o2 stats also dropped but came back with rest . The [facility] just wanted to make Dr. Geraldo Pitter aware of the pt's situation

## 2020-05-09 DIAGNOSIS — R918 Other nonspecific abnormal finding of lung field: Secondary | ICD-10-CM | POA: Diagnosis not present

## 2020-05-09 DIAGNOSIS — I517 Cardiomegaly: Secondary | ICD-10-CM | POA: Diagnosis not present

## 2020-05-09 DIAGNOSIS — I700 Atherosclerosis of aorta: Secondary | ICD-10-CM | POA: Diagnosis not present

## 2020-05-09 DIAGNOSIS — I7789 Other specified disorders of arteries and arterioles: Secondary | ICD-10-CM | POA: Diagnosis not present

## 2020-05-09 DIAGNOSIS — N261 Atrophy of kidney (terminal): Secondary | ICD-10-CM | POA: Diagnosis not present

## 2020-05-09 DIAGNOSIS — J84112 Idiopathic pulmonary fibrosis: Secondary | ICD-10-CM | POA: Diagnosis not present

## 2020-05-09 DIAGNOSIS — R001 Bradycardia, unspecified: Secondary | ICD-10-CM | POA: Diagnosis not present

## 2020-05-09 DIAGNOSIS — I2510 Atherosclerotic heart disease of native coronary artery without angina pectoris: Secondary | ICD-10-CM | POA: Diagnosis not present

## 2020-05-09 DIAGNOSIS — I289 Disease of pulmonary vessels, unspecified: Secondary | ICD-10-CM | POA: Diagnosis not present

## 2020-05-10 DIAGNOSIS — E10319 Type 1 diabetes mellitus with unspecified diabetic retinopathy without macular edema: Secondary | ICD-10-CM | POA: Diagnosis not present

## 2020-05-10 DIAGNOSIS — Z125 Encounter for screening for malignant neoplasm of prostate: Secondary | ICD-10-CM | POA: Diagnosis not present

## 2020-05-10 DIAGNOSIS — J9611 Chronic respiratory failure with hypoxia: Secondary | ICD-10-CM | POA: Diagnosis not present

## 2020-05-10 DIAGNOSIS — E785 Hyperlipidemia, unspecified: Secondary | ICD-10-CM | POA: Diagnosis not present

## 2020-05-10 DIAGNOSIS — Z79899 Other long term (current) drug therapy: Secondary | ICD-10-CM | POA: Diagnosis not present

## 2020-05-10 DIAGNOSIS — N189 Chronic kidney disease, unspecified: Secondary | ICD-10-CM | POA: Diagnosis not present

## 2020-05-10 DIAGNOSIS — J189 Pneumonia, unspecified organism: Secondary | ICD-10-CM | POA: Diagnosis not present

## 2020-05-11 DIAGNOSIS — I129 Hypertensive chronic kidney disease with stage 1 through stage 4 chronic kidney disease, or unspecified chronic kidney disease: Secondary | ICD-10-CM | POA: Diagnosis not present

## 2020-05-11 DIAGNOSIS — J9611 Chronic respiratory failure with hypoxia: Secondary | ICD-10-CM | POA: Diagnosis not present

## 2020-05-11 DIAGNOSIS — I5033 Acute on chronic diastolic (congestive) heart failure: Secondary | ICD-10-CM | POA: Diagnosis not present

## 2020-05-11 DIAGNOSIS — N183 Chronic kidney disease, stage 3 unspecified: Secondary | ICD-10-CM | POA: Diagnosis not present

## 2020-05-11 DIAGNOSIS — J189 Pneumonia, unspecified organism: Secondary | ICD-10-CM | POA: Diagnosis not present

## 2020-05-11 NOTE — Addendum Note (Signed)
Addended by: Truddie Hidden on: 05/11/2020 02:16 PM   Modules accepted: Orders

## 2020-05-13 DIAGNOSIS — I5033 Acute on chronic diastolic (congestive) heart failure: Secondary | ICD-10-CM | POA: Diagnosis not present

## 2020-05-13 DIAGNOSIS — J9611 Chronic respiratory failure with hypoxia: Secondary | ICD-10-CM | POA: Diagnosis not present

## 2020-05-13 DIAGNOSIS — J189 Pneumonia, unspecified organism: Secondary | ICD-10-CM | POA: Diagnosis not present

## 2020-05-13 DIAGNOSIS — I129 Hypertensive chronic kidney disease with stage 1 through stage 4 chronic kidney disease, or unspecified chronic kidney disease: Secondary | ICD-10-CM | POA: Diagnosis not present

## 2020-05-13 DIAGNOSIS — N183 Chronic kidney disease, stage 3 unspecified: Secondary | ICD-10-CM | POA: Diagnosis not present

## 2020-05-17 ENCOUNTER — Telehealth: Payer: Self-pay | Admitting: *Deleted

## 2020-05-17 DIAGNOSIS — J189 Pneumonia, unspecified organism: Secondary | ICD-10-CM | POA: Diagnosis not present

## 2020-05-17 DIAGNOSIS — I129 Hypertensive chronic kidney disease with stage 1 through stage 4 chronic kidney disease, or unspecified chronic kidney disease: Secondary | ICD-10-CM | POA: Diagnosis not present

## 2020-05-17 DIAGNOSIS — N183 Chronic kidney disease, stage 3 unspecified: Secondary | ICD-10-CM | POA: Diagnosis not present

## 2020-05-17 DIAGNOSIS — I5033 Acute on chronic diastolic (congestive) heart failure: Secondary | ICD-10-CM | POA: Diagnosis not present

## 2020-05-17 DIAGNOSIS — J9611 Chronic respiratory failure with hypoxia: Secondary | ICD-10-CM | POA: Diagnosis not present

## 2020-05-17 NOTE — Telephone Encounter (Signed)
Patient's wife per DPR was given detailed instructions per Myocardial Perfusion Study Information Sheet for the test on 05/24/2020 at 0745. Patient notified to arrive 15 minutes early and that it is imperative to arrive on time for appointment to keep from having the test rescheduled.  If you need to cancel or reschedule your appointment, please call the office within 24 hours of your appointment. . Patient verbalized understanding.Lucas Dunn, Ranae Palms No mychart available

## 2020-05-19 DIAGNOSIS — I129 Hypertensive chronic kidney disease with stage 1 through stage 4 chronic kidney disease, or unspecified chronic kidney disease: Secondary | ICD-10-CM | POA: Diagnosis not present

## 2020-05-19 DIAGNOSIS — J9611 Chronic respiratory failure with hypoxia: Secondary | ICD-10-CM | POA: Diagnosis not present

## 2020-05-19 DIAGNOSIS — J189 Pneumonia, unspecified organism: Secondary | ICD-10-CM | POA: Diagnosis not present

## 2020-05-19 DIAGNOSIS — N183 Chronic kidney disease, stage 3 unspecified: Secondary | ICD-10-CM | POA: Diagnosis not present

## 2020-05-19 DIAGNOSIS — I5033 Acute on chronic diastolic (congestive) heart failure: Secondary | ICD-10-CM | POA: Diagnosis not present

## 2020-05-20 DIAGNOSIS — D849 Immunodeficiency, unspecified: Secondary | ICD-10-CM | POA: Diagnosis not present

## 2020-05-20 DIAGNOSIS — I2510 Atherosclerotic heart disease of native coronary artery without angina pectoris: Secondary | ICD-10-CM | POA: Diagnosis not present

## 2020-05-20 DIAGNOSIS — R160 Hepatomegaly, not elsewhere classified: Secondary | ICD-10-CM | POA: Diagnosis not present

## 2020-05-20 DIAGNOSIS — J9611 Chronic respiratory failure with hypoxia: Secondary | ICD-10-CM | POA: Diagnosis not present

## 2020-05-20 DIAGNOSIS — Z9483 Pancreas transplant status: Secondary | ICD-10-CM | POA: Diagnosis not present

## 2020-05-20 DIAGNOSIS — I129 Hypertensive chronic kidney disease with stage 1 through stage 4 chronic kidney disease, or unspecified chronic kidney disease: Secondary | ICD-10-CM | POA: Diagnosis not present

## 2020-05-20 DIAGNOSIS — N183 Chronic kidney disease, stage 3 unspecified: Secondary | ICD-10-CM | POA: Diagnosis not present

## 2020-05-20 DIAGNOSIS — E7800 Pure hypercholesterolemia, unspecified: Secondary | ICD-10-CM | POA: Diagnosis not present

## 2020-05-20 DIAGNOSIS — D631 Anemia in chronic kidney disease: Secondary | ICD-10-CM | POA: Diagnosis not present

## 2020-05-20 DIAGNOSIS — I700 Atherosclerosis of aorta: Secondary | ICD-10-CM | POA: Diagnosis not present

## 2020-05-20 DIAGNOSIS — Z940 Kidney transplant status: Secondary | ICD-10-CM | POA: Diagnosis not present

## 2020-05-20 DIAGNOSIS — R911 Solitary pulmonary nodule: Secondary | ICD-10-CM | POA: Diagnosis not present

## 2020-05-20 DIAGNOSIS — Z79899 Other long term (current) drug therapy: Secondary | ICD-10-CM | POA: Diagnosis not present

## 2020-05-20 DIAGNOSIS — Z8616 Personal history of COVID-19: Secondary | ICD-10-CM | POA: Diagnosis not present

## 2020-05-20 DIAGNOSIS — J189 Pneumonia, unspecified organism: Secondary | ICD-10-CM | POA: Diagnosis not present

## 2020-05-20 DIAGNOSIS — Z9981 Dependence on supplemental oxygen: Secondary | ICD-10-CM | POA: Diagnosis not present

## 2020-05-20 DIAGNOSIS — Z87891 Personal history of nicotine dependence: Secondary | ICD-10-CM | POA: Diagnosis not present

## 2020-05-20 DIAGNOSIS — Z86711 Personal history of pulmonary embolism: Secondary | ICD-10-CM | POA: Diagnosis not present

## 2020-05-20 DIAGNOSIS — N2889 Other specified disorders of kidney and ureter: Secondary | ICD-10-CM | POA: Diagnosis not present

## 2020-05-20 DIAGNOSIS — I5033 Acute on chronic diastolic (congestive) heart failure: Secondary | ICD-10-CM | POA: Diagnosis not present

## 2020-05-20 DIAGNOSIS — Z79891 Long term (current) use of opiate analgesic: Secondary | ICD-10-CM | POA: Diagnosis not present

## 2020-05-20 DIAGNOSIS — Z7952 Long term (current) use of systemic steroids: Secondary | ICD-10-CM | POA: Diagnosis not present

## 2020-05-20 DIAGNOSIS — M8949 Other hypertrophic osteoarthropathy, multiple sites: Secondary | ICD-10-CM | POA: Diagnosis not present

## 2020-05-20 DIAGNOSIS — Z791 Long term (current) use of non-steroidal anti-inflammatories (NSAID): Secondary | ICD-10-CM | POA: Diagnosis not present

## 2020-05-24 ENCOUNTER — Other Ambulatory Visit: Payer: Self-pay

## 2020-05-24 ENCOUNTER — Ambulatory Visit (INDEPENDENT_AMBULATORY_CARE_PROVIDER_SITE_OTHER): Payer: Medicare Other

## 2020-05-24 VITALS — Ht 66.0 in | Wt 160.0 lb

## 2020-05-24 DIAGNOSIS — I10 Essential (primary) hypertension: Secondary | ICD-10-CM | POA: Diagnosis not present

## 2020-05-24 DIAGNOSIS — I700 Atherosclerosis of aorta: Secondary | ICD-10-CM | POA: Diagnosis not present

## 2020-05-24 DIAGNOSIS — I7091 Generalized atherosclerosis: Secondary | ICD-10-CM

## 2020-05-24 DIAGNOSIS — R0989 Other specified symptoms and signs involving the circulatory and respiratory systems: Secondary | ICD-10-CM

## 2020-05-24 LAB — MYOCARDIAL PERFUSION IMAGING
LV dias vol: 90 mL (ref 62–150)
LV sys vol: 41 mL
Peak HR: 101 {beats}/min
Rest HR: 67 {beats}/min
SDS: 0
SRS: 0
SSS: 0
TID: 0.9

## 2020-05-24 MED ORDER — TECHNETIUM TC 99M TETROFOSMIN IV KIT
31.5000 | PACK | Freq: Once | INTRAVENOUS | Status: AC | PRN
Start: 2020-05-24 — End: 2020-05-24
  Administered 2020-05-24: 31.5 via INTRAVENOUS

## 2020-05-24 MED ORDER — REGADENOSON 0.4 MG/5ML IV SOLN
0.4000 mg | Freq: Once | INTRAVENOUS | Status: AC
  Administered 2020-05-24: 0.4 mg via INTRAVENOUS

## 2020-05-24 MED ORDER — TECHNETIUM TC 99M TETROFOSMIN IV KIT
10.2000 | PACK | Freq: Once | INTRAVENOUS | Status: AC | PRN
  Administered 2020-05-24: 10.2 via INTRAVENOUS

## 2020-05-31 DIAGNOSIS — J84112 Idiopathic pulmonary fibrosis: Secondary | ICD-10-CM | POA: Diagnosis not present

## 2020-05-31 DIAGNOSIS — J9611 Chronic respiratory failure with hypoxia: Secondary | ICD-10-CM | POA: Diagnosis not present

## 2020-06-01 ENCOUNTER — Telehealth: Payer: Self-pay | Admitting: Cardiology

## 2020-06-01 NOTE — Telephone Encounter (Signed)
lmtcb

## 2020-06-01 NOTE — Telephone Encounter (Signed)
Stacey with [facility] aware that pt's wife was given the results. Results reviewed with her as well.

## 2020-06-01 NOTE — Telephone Encounter (Signed)
New Message  i have Lucas Dunn from [facility] wanting to speak with you about the patient's heart monitor

## 2020-06-06 DIAGNOSIS — I10 Essential (primary) hypertension: Secondary | ICD-10-CM | POA: Diagnosis not present

## 2020-06-06 DIAGNOSIS — N2889 Other specified disorders of kidney and ureter: Secondary | ICD-10-CM | POA: Diagnosis not present

## 2020-06-06 DIAGNOSIS — Z940 Kidney transplant status: Secondary | ICD-10-CM | POA: Diagnosis not present

## 2020-06-06 DIAGNOSIS — D849 Immunodeficiency, unspecified: Secondary | ICD-10-CM | POA: Diagnosis not present

## 2020-06-06 DIAGNOSIS — N1831 Chronic kidney disease, stage 3a: Secondary | ICD-10-CM | POA: Diagnosis not present

## 2020-06-07 ENCOUNTER — Ambulatory Visit: Payer: Medicare Other

## 2020-06-08 ENCOUNTER — Ambulatory Visit: Payer: Medicare Other

## 2020-06-14 ENCOUNTER — Ambulatory Visit: Payer: Medicare Other

## 2020-06-15 ENCOUNTER — Ambulatory Visit: Payer: Medicare Other

## 2020-07-05 DIAGNOSIS — J9611 Chronic respiratory failure with hypoxia: Secondary | ICD-10-CM | POA: Diagnosis not present

## 2020-07-05 DIAGNOSIS — J84112 Idiopathic pulmonary fibrosis: Secondary | ICD-10-CM | POA: Diagnosis not present

## 2020-07-08 DIAGNOSIS — J9611 Chronic respiratory failure with hypoxia: Secondary | ICD-10-CM | POA: Diagnosis not present

## 2020-07-08 DIAGNOSIS — I129 Hypertensive chronic kidney disease with stage 1 through stage 4 chronic kidney disease, or unspecified chronic kidney disease: Secondary | ICD-10-CM | POA: Diagnosis not present

## 2020-07-27 ENCOUNTER — Other Ambulatory Visit: Payer: Self-pay

## 2020-07-27 ENCOUNTER — Encounter: Payer: Self-pay | Admitting: Cardiology

## 2020-07-27 ENCOUNTER — Ambulatory Visit (INDEPENDENT_AMBULATORY_CARE_PROVIDER_SITE_OTHER): Payer: Medicare Other | Admitting: Cardiology

## 2020-07-27 VITALS — BP 195/90 | HR 70 | Ht 66.0 in | Wt 168.8 lb

## 2020-07-27 DIAGNOSIS — M810 Age-related osteoporosis without current pathological fracture: Secondary | ICD-10-CM | POA: Diagnosis not present

## 2020-07-27 DIAGNOSIS — I10 Essential (primary) hypertension: Secondary | ICD-10-CM | POA: Diagnosis not present

## 2020-07-27 DIAGNOSIS — Z940 Kidney transplant status: Secondary | ICD-10-CM | POA: Diagnosis not present

## 2020-07-27 DIAGNOSIS — I25119 Atherosclerotic heart disease of native coronary artery with unspecified angina pectoris: Secondary | ICD-10-CM | POA: Diagnosis not present

## 2020-07-27 DIAGNOSIS — I700 Atherosclerosis of aorta: Secondary | ICD-10-CM

## 2020-07-27 DIAGNOSIS — M8589 Other specified disorders of bone density and structure, multiple sites: Secondary | ICD-10-CM | POA: Diagnosis not present

## 2020-07-27 NOTE — Patient Instructions (Signed)
Medication Instructions:  No medication changes. *If you need a refill on your cardiac medications before your next appointment, please call your pharmacy*   Lab Work: None ordered If you have labs (blood work) drawn today and your tests are completely normal, you will receive your results only by: . MyChart Message (if you have MyChart) OR . A paper copy in the mail If you have any lab test that is abnormal or we need to change your treatment, we will call you to review the results.   Testing/Procedures: Your physician has requested that you have an echocardiogram. Echocardiography is a painless test that uses sound waves to create images of your heart. It provides your doctor with information about the size and shape of your heart and how well your heart's chambers and valves are working. This procedure takes approximately one hour. There are no restrictions for this procedure.     Follow-Up: At [facility], you and your health needs are our priority.  As part of our continuing mission to provide you with exceptional heart care, we have created designated Provider Care Teams.  These Care Teams include your primary Cardiologist (physician) and Advanced Practice Providers (APPs -  Physician Assistants and Nurse Practitioners) who all work together to provide you with the care you need, when you need it.  We recommend signing up for the patient portal called "MyChart".  Sign up information is provided on this After Visit Summary.  MyChart is used to connect with patients for Virtual Visits (Telemedicine).  Patients are able to view lab/test results, encounter notes, upcoming appointments, etc.  Non-urgent messages can be sent to your provider as well.   To learn more about what you can do with MyChart, go to [email or web address].    Your next appointment:   6 month(s)  The format for your next appointment:   In Person  Provider:   Rajan Revankar, MD   Other  Instructions  Echocardiogram An echocardiogram is a procedure that uses painless sound waves (ultrasound) to produce an image of the heart. Images from an echocardiogram can provide important information about:  Signs of coronary artery disease (CAD).  Aneurysm detection. An aneurysm is a weak or damaged part of an artery wall that bulges out from the normal force of blood pumping through the body.  Heart size and shape. Changes in the size or shape of the heart can be associated with certain conditions, including heart failure, aneurysm, and CAD.  Heart muscle function.  Heart valve function.  Signs of a past heart attack.  Fluid buildup around the heart.  Thickening of the heart muscle.  A tumor or infectious growth around the heart valves. Tell a health care provider about:  Any allergies you have.  All medicines you are taking, including vitamins, herbs, eye drops, creams, and over-the-counter medicines.  Any blood disorders you have.  Any surgeries you have had.  Any medical conditions you have.  Whether you are pregnant or may be pregnant. What are the risks? Generally, this is a safe procedure. However, problems may occur, including:  Allergic reaction to dye (contrast) that may be used during the procedure. What happens before the procedure? No specific preparation is needed. You may eat and drink normally. What happens during the procedure?   An IV tube may be inserted into one of your veins.  You may receive contrast through this tube. A contrast is an injection that improves the quality of the pictures from your heart.  A   gel will be applied to your chest.  A wand-like tool (transducer) will be moved over your chest. The gel will help to transmit the sound waves from the transducer.  The sound waves will harmlessly bounce off of your heart to allow the heart images to be captured in real-time motion. The images will be recorded on a computer. The  procedure may vary among health care providers and hospitals. What happens after the procedure?  You may return to your normal, everyday life, including diet, activities, and medicines, unless your health care provider tells you not to do that. Summary  An echocardiogram is a procedure that uses painless sound waves (ultrasound) to produce an image of the heart.  Images from an echocardiogram can provide important information about the size and shape of your heart, heart muscle function, heart valve function, and fluid buildup around your heart.  You do not need to do anything to prepare before this procedure. You may eat and drink normally.  After the echocardiogram is completed, you may return to your normal, everyday life, unless your health care provider tells you not to do that. This information is not intended to replace advice given to you by your health care provider. Make sure you discuss any questions you have with your health care provider. Document Revised: 04/02/2019 Document Reviewed: 01/12/2017 Elsevier Patient Education  2020 [employer].   

## 2020-07-27 NOTE — Progress Notes (Signed)
Cardiology Office Note:    Date:  07/27/2020   ID:  Lucas Dunn, DOB 18-Jan-1946, MRN [id number]  PCP:  Nicoletta Dress, MD  Cardiologist:  Jenean Lindau, MD   Referring MD: Nicoletta Dress, MD    ASSESSMENT:    1. Aortic atherosclerosis ([place])   2. Essential hypertension   3. Coronary artery disease involving native heart with angina pectoris, unspecified vessel or lesion type ([place])   4. S/P kidney transplant    PLAN:    In order of problems listed above:  1. Coronary artery disease: Primary prevention stressed with the patient.  Importance of compliance with diet medication stressed and vocalized understanding 2. Mixed dyslipidemia: On statin therapy.  Patient will have blood work next week with his primary care physician and send me a copy. 3. Essential hypertension: Blood pressure is stable.  He has an element of whitecoat hypertension.  His wife meticulously keeps a track of his blood pressures and they are fine and they are also followed by his nephrologist. 4. S/p renal and liver transplant: Managed by specialist and stable. 5. Cardiac murmur: Echocardiogram will be done to assess murmur heard on auscultation. 6. Patient will be seen in follow-up appointment in 6 months or earlier if the patient has any concerns    Medication Adjustments/Labs and Tests Ordered: Current medicines are reviewed at length with the patient today.  Concerns regarding medicines are outlined above.  Orders Placed This Encounter  Procedures  . ECHOCARDIOGRAM COMPLETE   No orders of the defined types were placed in this encounter.    No chief complaint on file.    History of Present Illness:    Lucas Dunn is a 74 y.o. male.  Patient has past medical history of coronary artery disease, essential hypertension dyslipidemia diabetes mellitus and renal and liver transplant.  He denies any problems at this time and takes care of activities of daily living.  He had Covid infection and was  treated and he feels much better.  He denies any chest pain orthopnea or PND.  His family member checks blood pressure on a regular basis and has brought blood pressure readings and they are fine.  His blood pressure is also monitored by his primary care physician and his nephrologist.  At the time of my evaluation, the patient is alert awake oriented and in no distress.  He has renal insufficiency.  Past Medical History:  Diagnosis Date  . Acute respiratory failure with hypoxia ([place]) 02/13/2020  . Amblyopia of left eye 10/10/2012  . 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 CAD (coronary artery disease) 10/10/2012  . Chronic pain of right hip 10/15/2014  . Coagulopathy ([place]) 02/13/2020  . COVID-19 virus detected 02/05/2020  . Diabetic macular edema of both eyes ([place]) 10/10/2012  . Essential hypertension 10/10/2012  . Immunosuppression ([place]) 05/05/2018  . Left renal mass 02/11/2020  . Liver mass, right lobe 02/11/2020  . Nuclear sclerotic cataract of left eye 10/10/2012  . Osteopenia 10/10/2012   Formatting of this note might be different from the original. A. Last BMD 09/2004.  Marland Kitchen PDR (proliferative diabetic retinopathy) ([place]) 10/10/2012   Formatting of this note might be different from the original. S/P  PPV  OD  . Pneumonia due to COVID-19 virus 02/11/2020  . Pseudophakia of right eye 10/10/2012  . Pulmonary embolism associated with COVID-19 ([place]) 02/11/2020  . Renal mass of kidney transplant 02/11/2020  . Right carotid bruit 10/10/2012  . S/P kidney transplant 09/08/2000   Formatting  of this note might be different from the original. A. 1B, 1DR match.    B. Kidney cold ischemia time 18 hours.    C. 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   D. Zenapax induction; 09/13/00 discharge creatinine 3.2.  E. 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  F. Negative renal transplant artery arteriogram 6/04.  . Stage 3a chronic kidney disease 02/11/2020  . Type 1 diabetes mellitus ([place]) 10/14/2012   Formatting of this note  might be different from the original. A. 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 B. Retinopathy C. Mild neuropathy.    History reviewed. No pertinent surgical history.  Current Medications: Current Meds  Medication Sig  . atorvastatin (LIPITOR) 20 MG tablet Take 20 mg by mouth daily.  . Calcium Carbonate-Vitamin D 600-200 MG-UNIT TABS Take 1 tablet by mouth 2 (two) times daily with a meal.  . furosemide (LASIX) 20 MG tablet Take 20 mg by mouth 2 (two) times daily.  Marland Kitchen latanoprost (XALATAN) 0.005 % ophthalmic solution 1 drop daily.  . mycophenolate (CELLCEPT) 250 MG capsule Take 500 mg by mouth 2 (two) times daily.  . predniSONE (DELTASONE) 20 MG tablet Take 20 mg by mouth daily.  Marland Kitchen ROCKLATAN 0.02-0.005 % SOLN Apply to eye.  . tacrolimus (PROGRAF) 0.5 MG capsule Take 0.5 mg by mouth. Take 2 tablets am and 1 tablet pm  . traMADol (ULTRAM) 50 MG tablet Take 50 mg by mouth as needed for pain.     Allergies:   Latex and Other   Social History   Socioeconomic History  . Marital status: Married    Spouse name: Not on file  . Number of children: Not on file  . Years of education: Not on file  . Highest education level: Not on file  Occupational History  . Not on file  Tobacco Use  . Smoking status: Former Research scientist (life sciences)  . Smokeless tobacco: Never Used  Substance and Sexual Activity  . Alcohol use: Not on file  . Drug use: Not on file  . Sexual activity: Not on file  Other Topics Concern  . Not on file  Social History Narrative  . Not on file   Social Determinants of Health   Financial Resource Strain:   . Difficulty of Paying Living Expenses:   Food Insecurity:   . Worried About Charity fundraiser in the Last Year:   . Arboriculturist in the Last Year:   Transportation Needs:   . Film/video editor (Medical):   Marland Kitchen Lack of Transportation (Non-Medical):   Physical Activity:   . Days of Exercise per Week:   . Minutes of Exercise per Session:   Stress:   . Feeling of Stress :   Social Connections:   .  Frequency of Communication with Friends and Family:   . Frequency of Social Gatherings with Friends and Family:   . Attends Religious Services:   . Active Member of Clubs or Organizations:   . Attends Archivist Meetings:   Marland Kitchen Marital Status:      Family History: The patient's family history is not on file.  ROS:   Please see the history of present illness.    All other systems reviewed and are negative.  EKGs/Labs/Other Studies Reviewed:    The following studies were reviewed today: EKG reveals sinus rhythm and nonspecific ST-T changes   Recent Labs: No results found for requested labs within last 8760 hours.  Recent Lipid Panel No results found for: CHOL, TRIG, HDL, CHOLHDL, VLDL, LDLCALC, LDLDIRECT  Physical Exam:    VS:  BP (!) 195/90 (BP Location: Right Arm)   Pulse 70   Ht 5\' 6"  (1.676 m)   Wt 168 lb 12.8 oz (76.6 kg)   SpO2 94%   BMI 27.25 kg/m     Wt Readings from Last 3 Encounters:  07/27/20 168 lb 12.8 oz (76.6 kg)  05/24/20 160 lb (72.6 kg)  04/21/20 160 lb 3.2 oz (72.7 kg)     GEN: Patient is in no acute distress HEENT: Normal NECK: No JVD; No carotid bruits LYMPHATICS: No lymphadenopathy CARDIAC: Hear sounds regular, 2/6 systolic murmur at the apex. RESPIRATORY:  Clear to auscultation without rales, wheezing or rhonchi  ABDOMEN: Soft, non-tender, non-distended MUSCULOSKELETAL:  No edema; No deformity  SKIN: Warm and dry NEUROLOGIC:  Alert and oriented x 3 PSYCHIATRIC:  Normal affect   Signed, Jenean Lindau, MD  07/27/2020 2:55 PM    [facility]

## 2020-08-05 DIAGNOSIS — N189 Chronic kidney disease, unspecified: Secondary | ICD-10-CM | POA: Diagnosis not present

## 2020-08-05 DIAGNOSIS — E10319 Type 1 diabetes mellitus with unspecified diabetic retinopathy without macular edema: Secondary | ICD-10-CM | POA: Diagnosis not present

## 2020-08-05 DIAGNOSIS — E1129 Type 2 diabetes mellitus with other diabetic kidney complication: Secondary | ICD-10-CM | POA: Diagnosis not present

## 2020-08-05 DIAGNOSIS — M810 Age-related osteoporosis without current pathological fracture: Secondary | ICD-10-CM | POA: Diagnosis not present

## 2020-08-05 DIAGNOSIS — E785 Hyperlipidemia, unspecified: Secondary | ICD-10-CM | POA: Diagnosis not present

## 2020-08-05 DIAGNOSIS — J9611 Chronic respiratory failure with hypoxia: Secondary | ICD-10-CM | POA: Diagnosis not present

## 2020-08-05 DIAGNOSIS — R809 Proteinuria, unspecified: Secondary | ICD-10-CM | POA: Diagnosis not present

## 2020-08-05 DIAGNOSIS — Z79899 Other long term (current) drug therapy: Secondary | ICD-10-CM | POA: Diagnosis not present

## 2020-08-05 DIAGNOSIS — I129 Hypertensive chronic kidney disease with stage 1 through stage 4 chronic kidney disease, or unspecified chronic kidney disease: Secondary | ICD-10-CM | POA: Diagnosis not present

## 2020-08-09 DIAGNOSIS — J84112 Idiopathic pulmonary fibrosis: Secondary | ICD-10-CM | POA: Diagnosis not present

## 2020-08-09 DIAGNOSIS — J9611 Chronic respiratory failure with hypoxia: Secondary | ICD-10-CM | POA: Diagnosis not present

## 2020-08-18 ENCOUNTER — Ambulatory Visit (INDEPENDENT_AMBULATORY_CARE_PROVIDER_SITE_OTHER): Payer: Medicare Other

## 2020-08-18 ENCOUNTER — Other Ambulatory Visit: Payer: Self-pay

## 2020-08-18 DIAGNOSIS — I25119 Atherosclerotic heart disease of native coronary artery with unspecified angina pectoris: Secondary | ICD-10-CM

## 2020-08-18 LAB — ECHOCARDIOGRAM COMPLETE
Area-P 1/2: 4.8 cm2
S' Lateral: 2.5 cm

## 2020-08-18 NOTE — Progress Notes (Signed)
Complete echocardiogram performed.  Jimmy [place]set Joshi RDCS, RVT  

## 2020-08-23 ENCOUNTER — Telehealth: Payer: Self-pay | Admitting: Cardiology

## 2020-08-23 NOTE — Telephone Encounter (Signed)
New message:    Patient returning a call back. Patient states the doctor call her and she could not understand.

## 2020-08-24 ENCOUNTER — Other Ambulatory Visit: Payer: Self-pay

## 2020-08-24 ENCOUNTER — Ambulatory Visit (INDEPENDENT_AMBULATORY_CARE_PROVIDER_SITE_OTHER): Payer: Medicare Other | Admitting: Cardiology

## 2020-08-24 ENCOUNTER — Encounter: Payer: Self-pay | Admitting: Cardiology

## 2020-08-24 VITALS — BP 122/80 | HR 69 | Ht 66.0 in | Wt 166.0 lb

## 2020-08-24 DIAGNOSIS — E782 Mixed hyperlipidemia: Secondary | ICD-10-CM | POA: Insufficient documentation

## 2020-08-24 DIAGNOSIS — I10 Essential (primary) hypertension: Secondary | ICD-10-CM | POA: Diagnosis not present

## 2020-08-24 DIAGNOSIS — Z940 Kidney transplant status: Secondary | ICD-10-CM

## 2020-08-24 DIAGNOSIS — I2510 Atherosclerotic heart disease of native coronary artery without angina pectoris: Secondary | ICD-10-CM | POA: Diagnosis not present

## 2020-08-24 HISTORY — DX: Mixed hyperlipidemia: E78.2

## 2020-08-24 NOTE — Progress Notes (Signed)
Cardiology Office Note:    Date:  08/24/2020   ID:  Lucas Dunn, DOB 1946-02-13, MRN [id number]  PCP:  Nicoletta Dress, MD  Cardiologist:  Jenean Lindau, MD   Referring MD: Nicoletta Dress, MD    ASSESSMENT:    1. Essential hypertension   2. Coronary artery disease involving native coronary artery of native heart without angina pectoris   3. S/P kidney transplant   4. Mixed dyslipidemia    PLAN:    In order of problems listed above:  1. Coronary artery disease: Secondary prevention stressed with the patient. Importance of compliance with diet medication stressed and he vocalized understanding. 2. Essential hypertension: Blood pressure stable 3. Mixed dyslipidemia and diabetes mellitus: On statin therapy and taking medications appropriately. Lab work was reviewed from [employer] 4. Mitral annular heavy calcification and echodensity: I discussed this with the patient at length and several options were given to him for evaluation of this including CT or MRI and TEE. In view of the inherent risks of the stress especially stress to his renal function and TEE being invasive procedure in this Covid pandemic environment patient decided on medical management and I respect his wishes. 5. He will be seen in follow-up appointment in February or earlier if he has any concerns. He will get in touch with me if he changes his mind. Patient and wife had multiple questions which were answered to their satisfaction.   Medication Adjustments/Labs and Tests Ordered: Current medicines are reviewed at length with the patient today.  Concerns regarding medicines are outlined above.  No orders of the defined types were placed in this encounter.  No orders of the defined types were placed in this encounter.    No chief complaint on file.    History of Present Illness:    Lucas Dunn is a 74 y.o. male. Patient has past medical history of coronary artery disease, diabetes mellitus essential  hypertension. He is post renal and pancreatic transplant. He denies any problems at this time and takes care of activities of daily living. No chest pain orthopnea or PND. He had an abnormal echo with significant calcification in the mitral and atrial area. He was called to discuss this finding. At the time of my evaluation, the patient is alert awake oriented and in no distress. He is a very active gentleman and exercises on a regular basis.  Past Medical History:  Diagnosis Date  . Acute respiratory failure with hypoxia ([place]) 02/13/2020  . Amblyopia of left eye 10/10/2012  . 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 CAD (coronary artery disease) 10/10/2012  . Chronic pain of right hip 10/15/2014  . Coagulopathy ([place]) 02/13/2020  . COVID-19 virus detected 02/05/2020  . Diabetic macular edema of both eyes ([place]) 10/10/2012  . Essential hypertension 10/10/2012  . Immunosuppression ([place]) 05/05/2018  . Left renal mass 02/11/2020  . Liver mass, right lobe 02/11/2020  . Nuclear sclerotic cataract of left eye 10/10/2012  . Osteopenia 10/10/2012   Formatting of this note might be different from the original. A. Last BMD 09/2004.  Marland Kitchen PDR (proliferative diabetic retinopathy) ([place]) 10/10/2012   Formatting of this note might be different from the original. S/P  PPV  OD  . Pneumonia due to COVID-19 virus 02/11/2020  . Pseudophakia of right eye 10/10/2012  . Pulmonary embolism associated with COVID-19 ([place]) 02/11/2020  . Renal mass of kidney transplant 02/11/2020  . Right carotid bruit 10/10/2012  . S/P kidney transplant 09/08/2000   Formatting of  this note might be different from the original. A. 1B, 1DR match.    B. Kidney cold ischemia time 18 hours.    C. 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   D. Zenapax induction; 09/13/00 discharge creatinine 3.2.  E. 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  F. Negative renal transplant artery arteriogram 6/04.  . Stage 3a chronic kidney disease 02/11/2020  . Type 1 diabetes mellitus  ([place]) 10/14/2012   Formatting of this note might be different from the original. A. 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 B. Retinopathy C. Mild neuropathy.    No past surgical history on file.  Current Medications: Current Meds  Medication Sig  . atorvastatin (LIPITOR) 20 MG tablet Take 20 mg by mouth daily.  . Calcium Carbonate-Vitamin D 600-200 MG-UNIT TABS Take 1 tablet by mouth 2 (two) times daily with a meal.  . furosemide (LASIX) 20 MG tablet Take 20 mg by mouth 2 (two) times daily.  Marland Kitchen glimepiride (AMARYL) 1 MG tablet Take 1 mg by mouth every morning.  . mycophenolate (CELLCEPT) 250 MG capsule Take 500 mg by mouth 2 (two) times daily.  . predniSONE (DELTASONE) 20 MG tablet Take 20 mg by mouth daily.  Marland Kitchen ROCKLATAN 0.02-0.005 % SOLN Apply to eye.  . tacrolimus (PROGRAF) 0.5 MG capsule Take 0.5 mg by mouth. Take 2 tablets am and 1 tablet pm  . traMADol (ULTRAM) 50 MG tablet Take 50 mg by mouth as needed for pain.     Allergies:   Latex and Other   Social History   Socioeconomic History  . Marital status: Married    Spouse name: Not on file  . Number of children: Not on file  . Years of education: Not on file  . Highest education level: Not on file  Occupational History  . Not on file  Tobacco Use  . Smoking status: Former Research scientist (life sciences)  . Smokeless tobacco: Never Used  Substance and Sexual Activity  . Alcohol use: Not on file  . Drug use: Not on file  . Sexual activity: Not on file  Other Topics Concern  . Not on file  Social History Narrative  . Not on file   Social Determinants of Health   Financial Resource Strain:   . Difficulty of Paying Living Expenses: Not on file  Food Insecurity:   . Worried About Charity fundraiser in the Last Year: Not on file  . Ran Out of Food in the Last Year: Not on file  Transportation Needs:   . Lack of Transportation (Medical): Not on file  . Lack of Transportation (Non-Medical): Not on file  Physical Activity:   . Days of Exercise per Week: Not on file    . Minutes of Exercise per Session: Not on file  Stress:   . Feeling of Stress : Not on file  Social Connections:   . Frequency of Communication with Friends and Family: Not on file  . Frequency of Social Gatherings with Friends and Family: Not on file  . Attends Religious Services: Not on file  . Active Member of Clubs or Organizations: Not on file  . Attends Archivist Meetings: Not on file  . Marital Status: Not on file     Family History: The patient's family history is not on file.  ROS:   Please see the history of present illness.    All other systems reviewed and are negative.  EKGs/Labs/Other Studies Reviewed:    The following studies were reviewed today: IMPRESSIONS    1. Left  ventricular ejection fraction, by estimation, is 60 to 65%. The  left ventricle has normal function. The left ventricle has no regional  wall motion abnormalities. There is mild left ventricular hypertrophy.  Left ventricular diastolic parameters  are consistent with Grade II diastolic dysfunction (pseudonormalization).  2. Right ventricular systolic function is normal. The right ventricular  size is normal. There is normal pulmonary artery systolic pressure.  3. Left atrial size was moderately dilated.  4. Large, roung calcification of the mitral annulus noted. Unusual shape.  Clinical corelation recommended. The mitral valve is normal in structure.  Mild mitral valve regurgitation. No evidence of mitral stenosis.  5. The aortic valve is normal in structure. Aortic valve regurgitation is  not visualized. No aortic stenosis is present.  6. The inferior vena cava is normal in size with greater than 50%  respiratory variability, suggesting right atrial pressure of 3 mmHg.    Recent Labs: No results found for requested labs within last 8760 hours.  Recent Lipid Panel No results found for: CHOL, TRIG, HDL, CHOLHDL, VLDL, LDLCALC, LDLDIRECT  Physical Exam:    VS:  BP 122/80    Pulse 69   Ht 5\' 6"  (1.676 m)   Wt 166 lb (75.3 kg)   SpO2 96%   BMI 26.79 kg/m     Wt Readings from Last 3 Encounters:  08/24/20 166 lb (75.3 kg)  07/27/20 168 lb 12.8 oz (76.6 kg)  05/24/20 160 lb (72.6 kg)     GEN: Patient is in no acute distress HEENT: Normal NECK: No JVD; No carotid bruits LYMPHATICS: No lymphadenopathy CARDIAC: Hear sounds regular, 2/6 systolic murmur at the apex. RESPIRATORY:  Clear to auscultation without rales, wheezing or rhonchi  ABDOMEN: Soft, non-tender, non-distended MUSCULOSKELETAL:  No edema; No deformity  SKIN: Warm and dry NEUROLOGIC:  Alert and oriented x 3 PSYCHIATRIC:  Normal affect   Signed, Jenean Lindau, MD  08/24/2020 2:42 PM    [facility]

## 2020-08-24 NOTE — Patient Instructions (Addendum)
Medication Instructions:  No medication changes. *If you need a refill on your cardiac medications before your next appointment, please call your pharmacy*   Lab Work: None ordered If you have labs (blood work) drawn today and your tests are completely normal, you will receive your results only by: Marland Kitchen MyChart Message (if you have MyChart) OR . A paper copy in the mail If you have any lab test that is abnormal or we need to change your treatment, we will call you to review the results.   Testing/Procedures: None ordered   Follow-Up: At [facility], you and your health needs are our priority.  As part of our continuing mission to provide you with exceptional heart care, we have created designated Provider Care Teams.  These Care Teams include your primary Cardiologist (physician) and Advanced Practice Providers (APPs -  Physician Assistants and Nurse Practitioners) who all work together to provide you with the care you need, when you need it.  We recommend signing up for the patient portal called "MyChart".  Sign up information is provided on this After Visit Summary.  MyChart is used to connect with patients for Virtual Visits (Telemedicine).  Patients are able to view lab/test results, encounter notes, upcoming appointments, etc.  Non-urgent messages can be sent to your provider as well.   To learn more about what you can do with MyChart, go to [email or web address].    Your next appointment:   02/19/21  The format for your next appointment:   In Person  Provider:   Jyl Heinz, MD   Other Instructions NA

## 2020-09-01 DIAGNOSIS — J9611 Chronic respiratory failure with hypoxia: Secondary | ICD-10-CM | POA: Diagnosis not present

## 2020-09-01 DIAGNOSIS — J84112 Idiopathic pulmonary fibrosis: Secondary | ICD-10-CM | POA: Diagnosis not present

## 2020-09-01 DIAGNOSIS — G4733 Obstructive sleep apnea (adult) (pediatric): Secondary | ICD-10-CM | POA: Diagnosis not present

## 2020-09-26 DIAGNOSIS — I281 Aneurysm of pulmonary artery: Secondary | ICD-10-CM | POA: Diagnosis not present

## 2020-09-26 DIAGNOSIS — K869 Disease of pancreas, unspecified: Secondary | ICD-10-CM | POA: Diagnosis not present

## 2020-09-26 DIAGNOSIS — Z8616 Personal history of COVID-19: Secondary | ICD-10-CM | POA: Diagnosis not present

## 2020-09-26 DIAGNOSIS — J84112 Idiopathic pulmonary fibrosis: Secondary | ICD-10-CM | POA: Diagnosis not present

## 2020-09-26 DIAGNOSIS — I517 Cardiomegaly: Secondary | ICD-10-CM | POA: Diagnosis not present

## 2020-09-26 DIAGNOSIS — I2510 Atherosclerotic heart disease of native coronary artery without angina pectoris: Secondary | ICD-10-CM | POA: Diagnosis not present

## 2020-09-26 DIAGNOSIS — N289 Disorder of kidney and ureter, unspecified: Secondary | ICD-10-CM | POA: Diagnosis not present

## 2020-09-26 DIAGNOSIS — I2721 Secondary pulmonary arterial hypertension: Secondary | ICD-10-CM | POA: Diagnosis not present

## 2020-09-26 DIAGNOSIS — I700 Atherosclerosis of aorta: Secondary | ICD-10-CM | POA: Diagnosis not present

## 2020-10-05 DIAGNOSIS — J9611 Chronic respiratory failure with hypoxia: Secondary | ICD-10-CM | POA: Diagnosis not present

## 2020-10-05 DIAGNOSIS — J84112 Idiopathic pulmonary fibrosis: Secondary | ICD-10-CM | POA: Diagnosis not present

## 2020-10-07 DIAGNOSIS — Z23 Encounter for immunization: Secondary | ICD-10-CM | POA: Diagnosis not present

## 2020-11-03 DIAGNOSIS — Z9483 Pancreas transplant status: Secondary | ICD-10-CM | POA: Diagnosis not present

## 2020-11-03 DIAGNOSIS — E1165 Type 2 diabetes mellitus with hyperglycemia: Secondary | ICD-10-CM | POA: Diagnosis not present

## 2020-11-03 DIAGNOSIS — I10 Essential (primary) hypertension: Secondary | ICD-10-CM | POA: Diagnosis not present

## 2020-11-03 DIAGNOSIS — Z23 Encounter for immunization: Secondary | ICD-10-CM | POA: Diagnosis not present

## 2020-11-03 DIAGNOSIS — T8619 Other complication of kidney transplant: Secondary | ICD-10-CM | POA: Diagnosis not present

## 2020-11-03 DIAGNOSIS — R809 Proteinuria, unspecified: Secondary | ICD-10-CM | POA: Diagnosis not present

## 2020-11-03 DIAGNOSIS — Z940 Kidney transplant status: Secondary | ICD-10-CM | POA: Diagnosis not present

## 2020-11-03 DIAGNOSIS — N281 Cyst of kidney, acquired: Secondary | ICD-10-CM | POA: Diagnosis not present

## 2020-11-03 DIAGNOSIS — Z79899 Other long term (current) drug therapy: Secondary | ICD-10-CM | POA: Diagnosis not present

## 2020-11-03 DIAGNOSIS — D849 Immunodeficiency, unspecified: Secondary | ICD-10-CM | POA: Diagnosis not present

## 2020-11-03 DIAGNOSIS — Z7952 Long term (current) use of systemic steroids: Secondary | ICD-10-CM | POA: Diagnosis not present

## 2020-11-03 DIAGNOSIS — N289 Disorder of kidney and ureter, unspecified: Secondary | ICD-10-CM | POA: Diagnosis not present

## 2020-11-03 DIAGNOSIS — D84821 Immunodeficiency due to drugs: Secondary | ICD-10-CM | POA: Diagnosis not present

## 2020-11-03 DIAGNOSIS — N2889 Other specified disorders of kidney and ureter: Secondary | ICD-10-CM | POA: Diagnosis not present

## 2020-11-07 DIAGNOSIS — E1129 Type 2 diabetes mellitus with other diabetic kidney complication: Secondary | ICD-10-CM | POA: Diagnosis not present

## 2020-11-07 DIAGNOSIS — R809 Proteinuria, unspecified: Secondary | ICD-10-CM | POA: Diagnosis not present

## 2020-11-07 DIAGNOSIS — E10319 Type 1 diabetes mellitus with unspecified diabetic retinopathy without macular edema: Secondary | ICD-10-CM | POA: Diagnosis not present

## 2020-11-07 DIAGNOSIS — M810 Age-related osteoporosis without current pathological fracture: Secondary | ICD-10-CM | POA: Diagnosis not present

## 2020-11-07 DIAGNOSIS — Z79899 Other long term (current) drug therapy: Secondary | ICD-10-CM | POA: Diagnosis not present

## 2020-11-07 DIAGNOSIS — I129 Hypertensive chronic kidney disease with stage 1 through stage 4 chronic kidney disease, or unspecified chronic kidney disease: Secondary | ICD-10-CM | POA: Diagnosis not present

## 2020-11-07 DIAGNOSIS — E785 Hyperlipidemia, unspecified: Secondary | ICD-10-CM | POA: Diagnosis not present

## 2020-11-07 DIAGNOSIS — N189 Chronic kidney disease, unspecified: Secondary | ICD-10-CM | POA: Diagnosis not present

## 2020-11-07 DIAGNOSIS — Z6828 Body mass index (BMI) 28.0-28.9, adult: Secondary | ICD-10-CM | POA: Diagnosis not present

## 2020-11-12 DIAGNOSIS — M19042 Primary osteoarthritis, left hand: Secondary | ICD-10-CM | POA: Diagnosis present

## 2020-11-12 DIAGNOSIS — J90 Pleural effusion, not elsewhere classified: Secondary | ICD-10-CM | POA: Diagnosis not present

## 2020-11-12 DIAGNOSIS — R918 Other nonspecific abnormal finding of lung field: Secondary | ICD-10-CM | POA: Diagnosis not present

## 2020-11-12 DIAGNOSIS — N183 Chronic kidney disease, stage 3 unspecified: Secondary | ICD-10-CM | POA: Diagnosis present

## 2020-11-12 DIAGNOSIS — I2510 Atherosclerotic heart disease of native coronary artery without angina pectoris: Secondary | ICD-10-CM | POA: Diagnosis not present

## 2020-11-12 DIAGNOSIS — N170 Acute kidney failure with tubular necrosis: Secondary | ICD-10-CM | POA: Diagnosis present

## 2020-11-12 DIAGNOSIS — I130 Hypertensive heart and chronic kidney disease with heart failure and stage 1 through stage 4 chronic kidney disease, or unspecified chronic kidney disease: Secondary | ICD-10-CM | POA: Diagnosis present

## 2020-11-12 DIAGNOSIS — R601 Generalized edema: Secondary | ICD-10-CM | POA: Diagnosis not present

## 2020-11-12 DIAGNOSIS — M19041 Primary osteoarthritis, right hand: Secondary | ICD-10-CM | POA: Diagnosis present

## 2020-11-12 DIAGNOSIS — E785 Hyperlipidemia, unspecified: Secondary | ICD-10-CM | POA: Diagnosis present

## 2020-11-12 DIAGNOSIS — Z8673 Personal history of transient ischemic attack (TIA), and cerebral infarction without residual deficits: Secondary | ICD-10-CM | POA: Diagnosis not present

## 2020-11-12 DIAGNOSIS — Z8701 Personal history of pneumonia (recurrent): Secondary | ICD-10-CM | POA: Diagnosis not present

## 2020-11-12 DIAGNOSIS — R635 Abnormal weight gain: Secondary | ICD-10-CM | POA: Diagnosis not present

## 2020-11-12 DIAGNOSIS — I5033 Acute on chronic diastolic (congestive) heart failure: Secondary | ICD-10-CM | POA: Diagnosis not present

## 2020-11-12 DIAGNOSIS — T8619 Other complication of kidney transplant: Secondary | ICD-10-CM | POA: Diagnosis present

## 2020-11-12 DIAGNOSIS — M7989 Other specified soft tissue disorders: Secondary | ICD-10-CM | POA: Diagnosis not present

## 2020-11-12 DIAGNOSIS — I10 Essential (primary) hypertension: Secondary | ICD-10-CM | POA: Diagnosis not present

## 2020-11-12 DIAGNOSIS — I129 Hypertensive chronic kidney disease with stage 1 through stage 4 chronic kidney disease, or unspecified chronic kidney disease: Secondary | ICD-10-CM | POA: Diagnosis not present

## 2020-11-12 DIAGNOSIS — N289 Disorder of kidney and ureter, unspecified: Secondary | ICD-10-CM | POA: Diagnosis present

## 2020-11-12 DIAGNOSIS — J984 Other disorders of lung: Secondary | ICD-10-CM | POA: Diagnosis not present

## 2020-11-12 DIAGNOSIS — Z9483 Pancreas transplant status: Secondary | ICD-10-CM | POA: Diagnosis not present

## 2020-11-12 DIAGNOSIS — E1122 Type 2 diabetes mellitus with diabetic chronic kidney disease: Secondary | ICD-10-CM | POA: Diagnosis present

## 2020-11-12 DIAGNOSIS — I110 Hypertensive heart disease with heart failure: Secondary | ICD-10-CM | POA: Diagnosis not present

## 2020-11-12 DIAGNOSIS — I340 Nonrheumatic mitral (valve) insufficiency: Secondary | ICD-10-CM | POA: Diagnosis not present

## 2020-11-12 DIAGNOSIS — Z79899 Other long term (current) drug therapy: Secondary | ICD-10-CM | POA: Diagnosis not present

## 2020-11-12 DIAGNOSIS — D84821 Immunodeficiency due to drugs: Secondary | ICD-10-CM | POA: Diagnosis not present

## 2020-11-12 DIAGNOSIS — J8410 Pulmonary fibrosis, unspecified: Secondary | ICD-10-CM | POA: Diagnosis present

## 2020-11-12 DIAGNOSIS — E1121 Type 2 diabetes mellitus with diabetic nephropathy: Secondary | ICD-10-CM | POA: Diagnosis present

## 2020-11-12 DIAGNOSIS — N179 Acute kidney failure, unspecified: Secondary | ICD-10-CM | POA: Diagnosis not present

## 2020-11-12 LAB — PROTIME-INR

## 2020-11-18 DIAGNOSIS — I5033 Acute on chronic diastolic (congestive) heart failure: Secondary | ICD-10-CM | POA: Diagnosis not present

## 2020-11-18 DIAGNOSIS — N189 Chronic kidney disease, unspecified: Secondary | ICD-10-CM | POA: Diagnosis not present

## 2020-11-18 DIAGNOSIS — Z9483 Pancreas transplant status: Secondary | ICD-10-CM | POA: Diagnosis not present

## 2020-11-18 DIAGNOSIS — N289 Disorder of kidney and ureter, unspecified: Secondary | ICD-10-CM | POA: Diagnosis not present

## 2020-11-18 DIAGNOSIS — Z940 Kidney transplant status: Secondary | ICD-10-CM | POA: Diagnosis not present

## 2020-11-18 DIAGNOSIS — I129 Hypertensive chronic kidney disease with stage 1 through stage 4 chronic kidney disease, or unspecified chronic kidney disease: Secondary | ICD-10-CM | POA: Diagnosis not present

## 2020-11-22 DIAGNOSIS — J069 Acute upper respiratory infection, unspecified: Secondary | ICD-10-CM | POA: Diagnosis not present

## 2020-11-24 DIAGNOSIS — Z940 Kidney transplant status: Secondary | ICD-10-CM | POA: Diagnosis not present

## 2020-11-24 DIAGNOSIS — I059 Rheumatic mitral valve disease, unspecified: Secondary | ICD-10-CM

## 2020-11-24 DIAGNOSIS — E1021 Type 1 diabetes mellitus with diabetic nephropathy: Secondary | ICD-10-CM | POA: Diagnosis not present

## 2020-11-24 DIAGNOSIS — I10 Essential (primary) hypertension: Secondary | ICD-10-CM | POA: Diagnosis not present

## 2020-11-24 DIAGNOSIS — I3481 Nonrheumatic mitral (valve) annulus calcification: Secondary | ICD-10-CM

## 2020-11-24 DIAGNOSIS — D849 Immunodeficiency, unspecified: Secondary | ICD-10-CM | POA: Diagnosis not present

## 2020-11-24 DIAGNOSIS — I5032 Chronic diastolic (congestive) heart failure: Secondary | ICD-10-CM

## 2020-11-24 HISTORY — DX: Nonrheumatic mitral (valve) annulus calcification: I34.81

## 2020-11-24 HISTORY — DX: Chronic diastolic (congestive) heart failure: I50.32

## 2020-11-24 HISTORY — DX: Rheumatic mitral valve disease, unspecified: I05.9

## 2020-12-19 DIAGNOSIS — I5032 Chronic diastolic (congestive) heart failure: Secondary | ICD-10-CM | POA: Diagnosis not present

## 2020-12-19 DIAGNOSIS — N189 Chronic kidney disease, unspecified: Secondary | ICD-10-CM | POA: Diagnosis not present

## 2020-12-19 DIAGNOSIS — I129 Hypertensive chronic kidney disease with stage 1 through stage 4 chronic kidney disease, or unspecified chronic kidney disease: Secondary | ICD-10-CM | POA: Diagnosis not present

## 2021-01-06 DIAGNOSIS — J84112 Idiopathic pulmonary fibrosis: Secondary | ICD-10-CM | POA: Diagnosis not present

## 2021-01-06 DIAGNOSIS — J9611 Chronic respiratory failure with hypoxia: Secondary | ICD-10-CM | POA: Diagnosis not present

## 2021-01-19 DIAGNOSIS — E10319 Type 1 diabetes mellitus with unspecified diabetic retinopathy without macular edema: Secondary | ICD-10-CM | POA: Diagnosis not present

## 2021-01-19 DIAGNOSIS — I5032 Chronic diastolic (congestive) heart failure: Secondary | ICD-10-CM | POA: Diagnosis not present

## 2021-01-19 DIAGNOSIS — I129 Hypertensive chronic kidney disease with stage 1 through stage 4 chronic kidney disease, or unspecified chronic kidney disease: Secondary | ICD-10-CM | POA: Diagnosis not present

## 2021-01-19 DIAGNOSIS — N189 Chronic kidney disease, unspecified: Secondary | ICD-10-CM | POA: Diagnosis not present

## 2021-01-26 ENCOUNTER — Other Ambulatory Visit: Payer: Self-pay

## 2021-01-30 ENCOUNTER — Ambulatory Visit: Payer: Medicare Other | Admitting: Cardiology

## 2021-02-02 ENCOUNTER — Encounter: Payer: Self-pay | Admitting: Cardiology

## 2021-02-02 ENCOUNTER — Other Ambulatory Visit: Payer: Self-pay

## 2021-02-02 ENCOUNTER — Ambulatory Visit (INDEPENDENT_AMBULATORY_CARE_PROVIDER_SITE_OTHER): Payer: Medicare Other | Admitting: Cardiology

## 2021-02-02 VITALS — BP 142/88 | HR 80 | Ht 66.0 in | Wt 171.4 lb

## 2021-02-02 DIAGNOSIS — Z940 Kidney transplant status: Secondary | ICD-10-CM | POA: Diagnosis not present

## 2021-02-02 DIAGNOSIS — I2510 Atherosclerotic heart disease of native coronary artery without angina pectoris: Secondary | ICD-10-CM | POA: Diagnosis not present

## 2021-02-02 DIAGNOSIS — I10 Essential (primary) hypertension: Secondary | ICD-10-CM | POA: Diagnosis not present

## 2021-02-02 DIAGNOSIS — E782 Mixed hyperlipidemia: Secondary | ICD-10-CM | POA: Diagnosis not present

## 2021-02-02 DIAGNOSIS — I059 Rheumatic mitral valve disease, unspecified: Secondary | ICD-10-CM

## 2021-02-02 DIAGNOSIS — I3481 Nonrheumatic mitral (valve) annulus calcification: Secondary | ICD-10-CM

## 2021-02-02 NOTE — Patient Instructions (Signed)

## 2021-02-02 NOTE — Progress Notes (Signed)
Cardiology Office Note:    Date:  02/02/2021   ID:  Lucas Dunn, DOB 05-02-1946, MRN [id number]  PCP:  Nicoletta Dress, MD  Cardiologist:  Jenean Lindau, MD   Referring MD: Nicoletta Dress, MD    ASSESSMENT:    1. Coronary artery disease involving native coronary artery of native heart without angina pectoris   2. Essential hypertension   3. Mitral valve annular calcification   4. S/P kidney transplant   5. Mixed dyslipidemia    PLAN:    In order of problems listed above:  1. Coronary artery disease: Secondary prevention stressed with patient.  Importance of compliance with diet medication stressed any vocalized understanding.  He walks on a regular basis and I am pleased with his effort. 2. Essential hypertension: Blood pressure stable and diet was emphasized. 3. Mixed dyslipidemia: Lipids were reviewed from the [facility] sheet and I discussed this with her. 4. Post renal transplant: Renal insufficiency.  Stable at this time and we will continue to monitor. 5. Patient will be seen in follow-up appointment in 6 months or earlier if the patient has any concerns    Medication Adjustments/Labs and Tests Ordered: Current medicines are reviewed at length with the patient today.  Concerns regarding medicines are outlined above.  No orders of the defined types were placed in this encounter.  No orders of the defined types were placed in this encounter.    No chief complaint on file.    History of Present Illness:    Lucas Dunn is a 75 y.o. male.  Patient has history of pancreatic cancer kidney transplant, essential hypertension, dyslipidemia and coronary artery disease.  He has renal insufficiency.  He denies any problems at this time and takes care of activities of daily living.  No chest pain orthopnea or PND.  At the time of my evaluation, the patient is alert awake oriented and in no distress.  He walks on a regular basis.  Past Medical History:  Diagnosis Date  .  Acute respiratory failure with hypoxia ([place]) 02/13/2020  . Amblyopia of left eye 10/10/2012  . Aortic atherosclerosis ([place]) 04/21/2020  . 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 Bradycardia 04/21/2020  . CAD (coronary artery disease) 10/10/2012  . CHF (congestive heart failure), NYHA class II, chronic, diastolic ([place]) 19/12/4780  . Chronic pain of right hip 10/15/2014  . Coagulopathy ([place]) 02/13/2020  . COVID-19 virus detected 02/05/2020  . Diabetic macular edema of both eyes ([place]) 10/10/2012  . Essential hypertension 10/10/2012  . Immunosuppression ([place]) 05/05/2018  . Left renal mass 02/11/2020  . Liver mass, right lobe 02/11/2020  . Mitral valve annular calcification 11/24/2020  . Mixed dyslipidemia 08/24/2020  . Nuclear sclerotic cataract of left eye 10/10/2012  . Osteopenia 10/10/2012   Formatting of this note might be different from the original. A. Last BMD 09/2004.  Marland Kitchen PDR (proliferative diabetic retinopathy) ([place]) 10/10/2012   Formatting of this note might be different from the original. S/P  PPV  OD  . Pneumonia due to COVID-19 virus 02/11/2020  . Pseudophakia of right eye 10/10/2012  . Pulmonary embolism associated with COVID-19 ([place]) 02/11/2020  . Renal mass of kidney transplant 02/11/2020  . Right carotid bruit 10/10/2012  . S/P kidney transplant 09/08/2000   Formatting of this note might be different from the original. A. 1B, 1DR match.    B. Kidney cold ischemia time 18 hours.    C. 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   D. Zenapax induction; 09/13/00  discharge creatinine 3.2.  E. 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  F. Negative renal transplant artery arteriogram 6/04.  . Stage 3a chronic kidney disease ([place]) 02/11/2020  . Type 1 diabetes mellitus ([place]) 10/14/2012   Formatting of this note might be different from the original. A. ESRD 10/99. B. Retinopathy C. Mild neuropathy.    Past Surgical History:  Procedure Laterality Date  . COMBINED KIDNEY-PANCREAS TRANSPLANT    . KIDNEY TRANSPLANT       Current Medications: Current Meds  Medication Sig  . amLODipine (NORVASC) 5 MG tablet Take 5 mg by mouth 2 (two) times daily.  Marland Kitchen atorvastatin (LIPITOR) 20 MG tablet Take 20 mg by mouth daily.  . Calcium Carbonate-Vitamin D 600-200 MG-UNIT TABS Take 1 tablet by mouth 2 (two) times daily with a meal.  . furosemide (LASIX) 20 MG tablet Take 40 mg by mouth 2 (two) times daily.  Marland Kitchen glimepiride (AMARYL) 1 MG tablet Take 1 mg by mouth every morning.  . mycophenolate (CELLCEPT) 250 MG capsule Take 500 mg by mouth 2 (two) times daily.  . predniSONE (DELTASONE) 20 MG tablet Take 10 mg by mouth daily in the afternoon.  Marland Kitchen ROCKLATAN 0.02-0.005 % SOLN Place 1 drop into the right eye at bedtime.  . tacrolimus (PROGRAF) 0.5 MG capsule Take 0.5 mg by mouth. Take 2 tablets am and 1 tablet pm     Allergies:   Latex and Other   Social History   Socioeconomic History  . Marital status: Married    Spouse name: Not on file  . Number of children: Not on file  . Years of education: Not on file  . Highest education level: Not on file  Occupational History  . Not on file  Tobacco Use  . Smoking status: Former Research scientist (life sciences)  . Smokeless tobacco: Never Used  Substance and Sexual Activity  . Alcohol use: Not on file  . Drug use: Not on file  . Sexual activity: Not on file  Other Topics Concern  . Not on file  Social History Narrative  . Not on file   Social Determinants of Health   Financial Resource Strain: Not on file  Food Insecurity: Not on file  Transportation Needs: Not on file  Physical Activity: Not on file  Stress: Not on file  Social Connections: Not on file     Family History: The patient's family history includes Diabetes in his brother; Heart disease in his father; Hypertension in his brother, father, and mother.  ROS:   Please see the history of present illness.    All other systems reviewed and are negative.  EKGs/Labs/Other Studies Reviewed:    The following studies were  reviewed today: I discussed my findings with the patient at length.  Patient was seen in the emergency room in November with fluid again and I reviewed those records.   Recent Labs: No results found for requested labs within last 8760 hours.  Recent Lipid Panel No results found for: CHOL, TRIG, HDL, CHOLHDL, VLDL, LDLCALC, LDLDIRECT  Physical Exam:    VS:  BP (!) 142/88   Pulse 80   Ht 5\' 6"  (1.676 m)   Wt 171 lb 6.4 oz (77.7 kg)   SpO2 95%   BMI 27.66 kg/m     Wt Readings from Last 3 Encounters:  02/02/21 171 lb 6.4 oz (77.7 kg)  08/24/20 166 lb (75.3 kg)  07/27/20 168 lb 12.8 oz (76.6 kg)     GEN: Patient is in no acute distress HEENT:  Normal NECK: No JVD; No carotid bruits LYMPHATICS: No lymphadenopathy CARDIAC: Hear sounds regular, 2/6 systolic murmur at the apex. RESPIRATORY:  Clear to auscultation without rales, wheezing or rhonchi  ABDOMEN: Soft, non-tender, non-distended MUSCULOSKELETAL:  No edema; No deformity  SKIN: Warm and dry NEUROLOGIC:  Alert and oriented x 3 PSYCHIATRIC:  Normal affect   Signed, Jenean Lindau, MD  02/02/2021 10:55 AM    [facility]

## 2021-02-10 DIAGNOSIS — J9611 Chronic respiratory failure with hypoxia: Secondary | ICD-10-CM | POA: Diagnosis not present

## 2021-02-10 DIAGNOSIS — J84112 Idiopathic pulmonary fibrosis: Secondary | ICD-10-CM | POA: Diagnosis not present

## 2021-02-13 DIAGNOSIS — R7989 Other specified abnormal findings of blood chemistry: Secondary | ICD-10-CM | POA: Diagnosis not present

## 2021-02-13 DIAGNOSIS — N2889 Other specified disorders of kidney and ureter: Secondary | ICD-10-CM | POA: Diagnosis not present

## 2021-02-13 DIAGNOSIS — I5032 Chronic diastolic (congestive) heart failure: Secondary | ICD-10-CM | POA: Diagnosis not present

## 2021-02-13 DIAGNOSIS — I110 Hypertensive heart disease with heart failure: Secondary | ICD-10-CM | POA: Diagnosis not present

## 2021-02-13 DIAGNOSIS — E1165 Type 2 diabetes mellitus with hyperglycemia: Secondary | ICD-10-CM | POA: Diagnosis not present

## 2021-02-13 DIAGNOSIS — D849 Immunodeficiency, unspecified: Secondary | ICD-10-CM | POA: Diagnosis not present

## 2021-02-13 DIAGNOSIS — Z23 Encounter for immunization: Secondary | ICD-10-CM | POA: Diagnosis not present

## 2021-02-13 DIAGNOSIS — D84821 Immunodeficiency due to drugs: Secondary | ICD-10-CM | POA: Diagnosis not present

## 2021-02-13 DIAGNOSIS — Z9483 Pancreas transplant status: Secondary | ICD-10-CM | POA: Diagnosis not present

## 2021-02-13 DIAGNOSIS — T8619 Other complication of kidney transplant: Secondary | ICD-10-CM | POA: Diagnosis not present

## 2021-02-13 DIAGNOSIS — Z79899 Other long term (current) drug therapy: Secondary | ICD-10-CM | POA: Diagnosis not present

## 2021-02-13 DIAGNOSIS — N1831 Chronic kidney disease, stage 3a: Secondary | ICD-10-CM | POA: Diagnosis not present

## 2021-02-13 DIAGNOSIS — Z940 Kidney transplant status: Secondary | ICD-10-CM | POA: Diagnosis not present

## 2021-02-13 DIAGNOSIS — I10 Essential (primary) hypertension: Secondary | ICD-10-CM | POA: Diagnosis not present

## 2021-02-16 DIAGNOSIS — E785 Hyperlipidemia, unspecified: Secondary | ICD-10-CM | POA: Diagnosis not present

## 2021-02-16 DIAGNOSIS — N189 Chronic kidney disease, unspecified: Secondary | ICD-10-CM | POA: Diagnosis not present

## 2021-02-16 DIAGNOSIS — R809 Proteinuria, unspecified: Secondary | ICD-10-CM | POA: Diagnosis not present

## 2021-02-16 DIAGNOSIS — Z79899 Other long term (current) drug therapy: Secondary | ICD-10-CM | POA: Diagnosis not present

## 2021-02-16 DIAGNOSIS — E10319 Type 1 diabetes mellitus with unspecified diabetic retinopathy without macular edema: Secondary | ICD-10-CM | POA: Diagnosis not present

## 2021-02-16 DIAGNOSIS — Z6826 Body mass index (BMI) 26.0-26.9, adult: Secondary | ICD-10-CM | POA: Diagnosis not present

## 2021-02-16 DIAGNOSIS — E1129 Type 2 diabetes mellitus with other diabetic kidney complication: Secondary | ICD-10-CM | POA: Diagnosis not present

## 2021-02-16 DIAGNOSIS — I129 Hypertensive chronic kidney disease with stage 1 through stage 4 chronic kidney disease, or unspecified chronic kidney disease: Secondary | ICD-10-CM | POA: Diagnosis not present

## 2021-02-16 DIAGNOSIS — I5032 Chronic diastolic (congestive) heart failure: Secondary | ICD-10-CM | POA: Diagnosis not present

## 2021-03-04 DIAGNOSIS — Z9981 Dependence on supplemental oxygen: Secondary | ICD-10-CM | POA: Diagnosis not present

## 2021-03-04 DIAGNOSIS — Z7952 Long term (current) use of systemic steroids: Secondary | ICD-10-CM | POA: Diagnosis not present

## 2021-03-04 DIAGNOSIS — R0602 Shortness of breath: Secondary | ICD-10-CM | POA: Diagnosis not present

## 2021-03-04 DIAGNOSIS — R0902 Hypoxemia: Secondary | ICD-10-CM | POA: Diagnosis not present

## 2021-03-04 DIAGNOSIS — I5032 Chronic diastolic (congestive) heart failure: Secondary | ICD-10-CM | POA: Diagnosis present

## 2021-03-04 DIAGNOSIS — Z79899 Other long term (current) drug therapy: Secondary | ICD-10-CM | POA: Diagnosis not present

## 2021-03-04 DIAGNOSIS — I517 Cardiomegaly: Secondary | ICD-10-CM | POA: Diagnosis not present

## 2021-03-04 DIAGNOSIS — Z881 Allergy status to other antibiotic agents status: Secondary | ICD-10-CM | POA: Diagnosis not present

## 2021-03-04 DIAGNOSIS — M159 Polyosteoarthritis, unspecified: Secondary | ICD-10-CM | POA: Diagnosis present

## 2021-03-04 DIAGNOSIS — E1022 Type 1 diabetes mellitus with diabetic chronic kidney disease: Secondary | ICD-10-CM | POA: Diagnosis present

## 2021-03-04 DIAGNOSIS — Z7984 Long term (current) use of oral hypoglycemic drugs: Secondary | ICD-10-CM | POA: Diagnosis not present

## 2021-03-04 DIAGNOSIS — Z9483 Pancreas transplant status: Secondary | ICD-10-CM | POA: Diagnosis not present

## 2021-03-04 DIAGNOSIS — Z940 Kidney transplant status: Secondary | ICD-10-CM | POA: Diagnosis not present

## 2021-03-04 DIAGNOSIS — J159 Unspecified bacterial pneumonia: Secondary | ICD-10-CM | POA: Diagnosis present

## 2021-03-04 DIAGNOSIS — E7800 Pure hypercholesterolemia, unspecified: Secondary | ICD-10-CM | POA: Diagnosis present

## 2021-03-04 DIAGNOSIS — J8410 Pulmonary fibrosis, unspecified: Secondary | ICD-10-CM | POA: Diagnosis present

## 2021-03-04 DIAGNOSIS — N289 Disorder of kidney and ureter, unspecified: Secondary | ICD-10-CM | POA: Diagnosis not present

## 2021-03-04 DIAGNOSIS — I509 Heart failure, unspecified: Secondary | ICD-10-CM | POA: Diagnosis present

## 2021-03-04 DIAGNOSIS — J9601 Acute respiratory failure with hypoxia: Secondary | ICD-10-CM | POA: Diagnosis present

## 2021-03-04 DIAGNOSIS — D849 Immunodeficiency, unspecified: Secondary | ICD-10-CM | POA: Diagnosis not present

## 2021-03-04 DIAGNOSIS — I130 Hypertensive heart and chronic kidney disease with heart failure and stage 1 through stage 4 chronic kidney disease, or unspecified chronic kidney disease: Secondary | ICD-10-CM | POA: Diagnosis not present

## 2021-03-04 DIAGNOSIS — E871 Hypo-osmolality and hyponatremia: Secondary | ICD-10-CM | POA: Diagnosis present

## 2021-03-04 DIAGNOSIS — R509 Fever, unspecified: Secondary | ICD-10-CM | POA: Diagnosis not present

## 2021-03-04 DIAGNOSIS — J90 Pleural effusion, not elsewhere classified: Secondary | ICD-10-CM | POA: Diagnosis not present

## 2021-03-04 DIAGNOSIS — Z8701 Personal history of pneumonia (recurrent): Secondary | ICD-10-CM | POA: Diagnosis not present

## 2021-03-04 DIAGNOSIS — R059 Cough, unspecified: Secondary | ICD-10-CM | POA: Diagnosis not present

## 2021-03-04 DIAGNOSIS — I2510 Atherosclerotic heart disease of native coronary artery without angina pectoris: Secondary | ICD-10-CM | POA: Diagnosis present

## 2021-03-04 DIAGNOSIS — N183 Chronic kidney disease, stage 3 unspecified: Secondary | ICD-10-CM | POA: Diagnosis present

## 2021-03-04 DIAGNOSIS — J9690 Respiratory failure, unspecified, unspecified whether with hypoxia or hypercapnia: Secondary | ICD-10-CM | POA: Diagnosis not present

## 2021-03-04 DIAGNOSIS — J189 Pneumonia, unspecified organism: Secondary | ICD-10-CM | POA: Diagnosis not present

## 2021-03-15 DIAGNOSIS — J90 Pleural effusion, not elsewhere classified: Secondary | ICD-10-CM | POA: Diagnosis not present

## 2021-03-15 DIAGNOSIS — N189 Chronic kidney disease, unspecified: Secondary | ICD-10-CM | POA: Diagnosis not present

## 2021-03-15 DIAGNOSIS — N179 Acute kidney failure, unspecified: Secondary | ICD-10-CM | POA: Diagnosis not present

## 2021-03-15 DIAGNOSIS — J189 Pneumonia, unspecified organism: Secondary | ICD-10-CM | POA: Diagnosis not present

## 2021-03-27 DIAGNOSIS — Z940 Kidney transplant status: Secondary | ICD-10-CM | POA: Diagnosis not present

## 2021-03-27 DIAGNOSIS — I10 Essential (primary) hypertension: Secondary | ICD-10-CM | POA: Diagnosis not present

## 2021-03-27 DIAGNOSIS — Z9483 Pancreas transplant status: Secondary | ICD-10-CM | POA: Diagnosis not present

## 2021-03-27 DIAGNOSIS — D849 Immunodeficiency, unspecified: Secondary | ICD-10-CM | POA: Diagnosis not present

## 2021-03-29 DIAGNOSIS — N189 Chronic kidney disease, unspecified: Secondary | ICD-10-CM | POA: Diagnosis not present

## 2021-03-29 DIAGNOSIS — I129 Hypertensive chronic kidney disease with stage 1 through stage 4 chronic kidney disease, or unspecified chronic kidney disease: Secondary | ICD-10-CM | POA: Diagnosis not present

## 2021-03-29 DIAGNOSIS — I5032 Chronic diastolic (congestive) heart failure: Secondary | ICD-10-CM | POA: Diagnosis not present

## 2021-04-28 DIAGNOSIS — N189 Chronic kidney disease, unspecified: Secondary | ICD-10-CM | POA: Diagnosis not present

## 2021-04-28 DIAGNOSIS — I129 Hypertensive chronic kidney disease with stage 1 through stage 4 chronic kidney disease, or unspecified chronic kidney disease: Secondary | ICD-10-CM | POA: Diagnosis not present

## 2021-04-28 DIAGNOSIS — I5032 Chronic diastolic (congestive) heart failure: Secondary | ICD-10-CM | POA: Diagnosis not present

## 2021-04-28 DIAGNOSIS — Z139 Encounter for screening, unspecified: Secondary | ICD-10-CM | POA: Diagnosis not present

## 2021-05-03 DIAGNOSIS — N2889 Other specified disorders of kidney and ureter: Secondary | ICD-10-CM | POA: Diagnosis not present

## 2021-05-08 ENCOUNTER — Other Ambulatory Visit: Payer: Self-pay

## 2021-05-08 ENCOUNTER — Ambulatory Visit (INDEPENDENT_AMBULATORY_CARE_PROVIDER_SITE_OTHER): Payer: Medicare Other | Admitting: Cardiology

## 2021-05-08 ENCOUNTER — Encounter: Payer: Self-pay | Admitting: Cardiology

## 2021-05-08 VITALS — BP 140/90 | HR 80 | Ht 66.0 in | Wt 159.8 lb

## 2021-05-08 DIAGNOSIS — I700 Atherosclerosis of aorta: Secondary | ICD-10-CM

## 2021-05-08 DIAGNOSIS — I2699 Other pulmonary embolism without acute cor pulmonale: Secondary | ICD-10-CM | POA: Diagnosis not present

## 2021-05-08 DIAGNOSIS — C649 Malignant neoplasm of unspecified kidney, except renal pelvis: Secondary | ICD-10-CM | POA: Insufficient documentation

## 2021-05-08 DIAGNOSIS — Z940 Kidney transplant status: Secondary | ICD-10-CM | POA: Diagnosis not present

## 2021-05-08 DIAGNOSIS — E782 Mixed hyperlipidemia: Secondary | ICD-10-CM | POA: Diagnosis not present

## 2021-05-08 DIAGNOSIS — C642 Malignant neoplasm of left kidney, except renal pelvis: Secondary | ICD-10-CM | POA: Insufficient documentation

## 2021-05-08 DIAGNOSIS — U071 COVID-19: Secondary | ICD-10-CM

## 2021-05-08 DIAGNOSIS — T8619 Other complication of kidney transplant: Secondary | ICD-10-CM

## 2021-05-08 DIAGNOSIS — I10 Essential (primary) hypertension: Secondary | ICD-10-CM

## 2021-05-08 DIAGNOSIS — I2510 Atherosclerotic heart disease of native coronary artery without angina pectoris: Secondary | ICD-10-CM

## 2021-05-08 HISTORY — DX: Malignant neoplasm of unspecified kidney, except renal pelvis: C64.9

## 2021-05-08 HISTORY — DX: Other complication of kidney transplant: T86.19

## 2021-05-08 HISTORY — DX: Malignant neoplasm of left kidney, except renal pelvis: C64.2

## 2021-05-08 NOTE — Patient Instructions (Signed)

## 2021-05-08 NOTE — Progress Notes (Signed)
Cardiology Office Note:    Date:  05/08/2021   ID:  [name], DOB 1946/09/26, MRN [id number]  PCP:  Nicoletta Dress, MD  Cardiologist:  Jenean Lindau, MD   Referring MD: Nicoletta Dress, MD    ASSESSMENT:    1. Coronary artery disease involving native coronary artery of native heart without angina pectoris   2. Essential hypertension   3. Pulmonary embolism associated with COVID-19 ([place])   4. Aortic atherosclerosis ([place])   5. S/P kidney transplant   6. Mixed dyslipidemia    PLAN:    In order of problems listed above:  1. Coronary artery disease: Secondary prevention stressed with the patient.  Importance of compliance with diet medication stressed and he vocalized understanding.  He was advised to walk at least half an hour a day 5 days a week. 2. Essential hypertension: Blood pressure stable and diet was emphasized.  He tells me that his blood pressure is better at home and he has some element of whitecoat hypertension. 3. Mixed dyslipidemia: On statin therapy and lipids were reviewed and they are fine 4. Post kidney transplant: Stable renal function is abnormal and followed by nephrologist. 5. Patient will be seen in follow-up appointment in 6 months or earlier if the patient has any concerns    Medication Adjustments/Labs and Tests Ordered: Current medicines are reviewed at length with the patient today.  Concerns regarding medicines are outlined above.  No orders of the defined types were placed in this encounter.  No orders of the defined types were placed in this encounter.    No chief complaint on file.    History of Present Illness:    Lucas Dunn is a 75 y.o. male.  Patient has past medical history of coronary artery disease, essential hypertension, mixed dyslipidemia and diabetes mellitus.  He is post renal transplant.  He denies any problems at this time and takes care of activities of daily living.  No chest pain orthopnea or PND.  At the time of  my evaluation, the patient is alert awake oriented and in no distress.  Clinically at this time is doing well.  He leads a sedentary lifestyle.  Past Medical History:  Diagnosis Date  . Acute respiratory failure with hypoxia ([place]) 02/13/2020  . Amblyopia of left eye 10/10/2012  . Aortic atherosclerosis ([place]) 04/21/2020  . 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 Bradycardia 04/21/2020  . CAD (coronary artery disease) 10/10/2012  . CHF (congestive heart failure), NYHA class II, chronic, diastolic ([place]) 26/08/4853  . Chronic pain of right hip 10/15/2014  . Coagulopathy ([place]) 02/13/2020  . COVID-19 virus detected 02/05/2020  . Diabetic macular edema of both eyes ([place]) 10/10/2012  . Essential hypertension 10/10/2012  . Immunosuppression ([place]) 05/05/2018  . Left renal mass 02/11/2020  . Liver mass, right lobe 02/11/2020  . Mitral valve annular calcification 11/24/2020  . Mixed dyslipidemia 08/24/2020  . Nuclear sclerotic cataract of left eye 10/10/2012  . Osteopenia 10/10/2012   Formatting of this note might be different from the original. A. Last BMD 09/2004.  Marland Kitchen PDR (proliferative diabetic retinopathy) ([place]) 10/10/2012   Formatting of this note might be different from the original. S/P  PPV  OD  . Pneumonia due to COVID-19 virus 02/11/2020  . Pseudophakia of right eye 10/10/2012  . Pulmonary embolism associated with COVID-19 ([place]) 02/11/2020  . Renal mass of kidney transplant 02/11/2020  . Right carotid bruit 10/10/2012  . S/P kidney transplant 09/08/2000   Formatting of this note  might be different from the original. A. 1B, 1DR match.    B. Kidney cold ischemia time 18 hours.    C. 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   D. Zenapax induction; 09/13/00 discharge creatinine 3.2.  E. 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  F. Negative renal transplant artery arteriogram 6/04.  . Stage 3a chronic kidney disease ([place]) 02/11/2020  . Type 1 diabetes mellitus ([place]) 10/14/2012   Formatting of this note might be different  from the original. A. 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 B. Retinopathy C. Mild neuropathy.    Past Surgical History:  Procedure Laterality Date  . COMBINED KIDNEY-PANCREAS TRANSPLANT    . KIDNEY TRANSPLANT      Current Medications: Current Meds  Medication Sig  . amLODipine (NORVASC) 5 MG tablet Take 5 mg by mouth daily.  Marland Kitchen atorvastatin (LIPITOR) 20 MG tablet Take 20 mg by mouth daily.  . Calcium Carbonate-Vitamin D 600-200 MG-UNIT TABS Take 1 tablet by mouth 2 (two) times daily with a meal.  . famotidine (PEPCID) 20 MG tablet Take 20 mg by mouth as needed for heartburn or indigestion.  . furosemide (LASIX) 20 MG tablet Take 40 mg by mouth every morning. And takes 20 mg in the evening  . glimepiride (AMARYL) 1 MG tablet Take 1 mg by mouth every morning.  . mycophenolate (CELLCEPT) 250 MG capsule Take 500 mg by mouth 2 (two) times daily.  Marland Kitchen ROCKLATAN 0.02-0.005 % SOLN Place 1 drop into the right eye at bedtime.  . tacrolimus (PROGRAF) 0.5 MG capsule Take 0.5 mg by mouth. Take 2 tablets am and 1 tablet pm     Allergies:   Latex and Other   Social History   Socioeconomic History  . Marital status: Married    Spouse name: Not on file  . Number of children: Not on file  . Years of education: Not on file  . Highest education level: Not on file  Occupational History  . Not on file  Tobacco Use  . Smoking status: Former Research scientist (life sciences)  . Smokeless tobacco: Never Used  Substance and Sexual Activity  . Alcohol use: Not on file  . Drug use: Not on file  . Sexual activity: Not on file  Other Topics Concern  . Not on file  Social History Narrative  . Not on file   Social Determinants of Health   Financial Resource Strain: Not on file  Food Insecurity: Not on file  Transportation Needs: Not on file  Physical Activity: Not on file  Stress: Not on file  Social Connections: Not on file     Family History: The patient's family history includes Diabetes in his brother; Heart disease in his father;  Hypertension in his brother, father, and mother.  ROS:   Please see the history of present illness.    All other systems reviewed and are negative.  EKGs/Labs/Other Studies Reviewed:    The following studies were reviewed today: I discussed lab work with the patient at [facility]: No results found for requested labs within last 8760 hours.  Recent Lipid Panel No results found for: CHOL, TRIG, HDL, CHOLHDL, VLDL, LDLCALC, LDLDIRECT  Physical Exam:    VS:  BP 140/90   Pulse 80   Ht 5\' 6"  (1.676 m)   Wt 159 lb 12.8 oz (72.5 kg)   SpO2 95%   BMI 25.79 kg/m     Wt Readings from Last 3 Encounters:  05/08/21 159 lb 12.8 oz (72.5 kg)  02/02/21 171 lb 6.4  oz (77.7 kg)  08/24/20 166 lb (75.3 kg)     GEN: Patient is in no acute distress HEENT: Normal NECK: No JVD; No carotid bruits LYMPHATICS: No lymphadenopathy CARDIAC: Hear sounds regular, 2/6 systolic murmur at the apex. RESPIRATORY:  Clear to auscultation without rales, wheezing or rhonchi  ABDOMEN: Soft, non-tender, non-distended MUSCULOSKELETAL:  No edema; No deformity  SKIN: Warm and dry NEUROLOGIC:  Alert and oriented x 3 PSYCHIATRIC:  Normal affect   Signed, Jenean Lindau, MD  05/08/2021 9:06 AM    [place]

## 2021-05-10 DIAGNOSIS — T8619 Other complication of kidney transplant: Secondary | ICD-10-CM | POA: Diagnosis not present

## 2021-05-10 DIAGNOSIS — C649 Malignant neoplasm of unspecified kidney, except renal pelvis: Secondary | ICD-10-CM | POA: Diagnosis not present

## 2021-05-10 DIAGNOSIS — Z4822 Encounter for aftercare following kidney transplant: Secondary | ICD-10-CM | POA: Diagnosis not present

## 2021-05-10 DIAGNOSIS — C802 Malignant neoplasm associated with transplanted organ: Secondary | ICD-10-CM | POA: Diagnosis not present

## 2021-05-10 DIAGNOSIS — Z940 Kidney transplant status: Secondary | ICD-10-CM | POA: Diagnosis not present

## 2021-05-10 DIAGNOSIS — C642 Malignant neoplasm of left kidney, except renal pelvis: Secondary | ICD-10-CM | POA: Diagnosis not present

## 2021-05-10 DIAGNOSIS — Z87891 Personal history of nicotine dependence: Secondary | ICD-10-CM | POA: Diagnosis not present

## 2021-05-19 DIAGNOSIS — J9611 Chronic respiratory failure with hypoxia: Secondary | ICD-10-CM | POA: Diagnosis not present

## 2021-05-19 DIAGNOSIS — J84112 Idiopathic pulmonary fibrosis: Secondary | ICD-10-CM | POA: Diagnosis not present

## 2021-05-19 DIAGNOSIS — G4736 Sleep related hypoventilation in conditions classified elsewhere: Secondary | ICD-10-CM | POA: Diagnosis not present

## 2021-05-25 DIAGNOSIS — G4736 Sleep related hypoventilation in conditions classified elsewhere: Secondary | ICD-10-CM | POA: Diagnosis not present

## 2021-05-25 DIAGNOSIS — G4733 Obstructive sleep apnea (adult) (pediatric): Secondary | ICD-10-CM | POA: Diagnosis not present

## 2021-05-26 DIAGNOSIS — I5032 Chronic diastolic (congestive) heart failure: Secondary | ICD-10-CM | POA: Diagnosis not present

## 2021-05-26 DIAGNOSIS — N189 Chronic kidney disease, unspecified: Secondary | ICD-10-CM | POA: Diagnosis not present

## 2021-05-26 DIAGNOSIS — E785 Hyperlipidemia, unspecified: Secondary | ICD-10-CM | POA: Diagnosis not present

## 2021-05-26 DIAGNOSIS — Z125 Encounter for screening for malignant neoplasm of prostate: Secondary | ICD-10-CM | POA: Diagnosis not present

## 2021-05-26 DIAGNOSIS — E10319 Type 1 diabetes mellitus with unspecified diabetic retinopathy without macular edema: Secondary | ICD-10-CM | POA: Diagnosis not present

## 2021-05-26 DIAGNOSIS — I129 Hypertensive chronic kidney disease with stage 1 through stage 4 chronic kidney disease, or unspecified chronic kidney disease: Secondary | ICD-10-CM | POA: Diagnosis not present

## 2021-05-26 DIAGNOSIS — Z79899 Other long term (current) drug therapy: Secondary | ICD-10-CM | POA: Diagnosis not present

## 2021-06-02 DIAGNOSIS — J9611 Chronic respiratory failure with hypoxia: Secondary | ICD-10-CM | POA: Diagnosis not present

## 2021-06-02 DIAGNOSIS — G4736 Sleep related hypoventilation in conditions classified elsewhere: Secondary | ICD-10-CM | POA: Diagnosis not present

## 2021-06-02 DIAGNOSIS — J84112 Idiopathic pulmonary fibrosis: Secondary | ICD-10-CM | POA: Diagnosis not present

## 2021-06-05 DIAGNOSIS — E103591 Type 1 diabetes mellitus with proliferative diabetic retinopathy without macular edema, right eye: Secondary | ICD-10-CM | POA: Diagnosis not present

## 2021-06-06 ENCOUNTER — Telehealth: Payer: Self-pay | Admitting: *Deleted

## 2021-06-06 DIAGNOSIS — G4734 Idiopathic sleep related nonobstructive alveolar hypoventilation: Secondary | ICD-10-CM

## 2021-06-06 HISTORY — DX: Idiopathic sleep related nonobstructive alveolar hypoventilation: G47.34

## 2021-06-06 NOTE — Telephone Encounter (Signed)
   [facility] Pre-operative Risk Assessment    Request for surgical clearance:  What type of surgery is being performed? Partial Nephrectomy, Transplant Kidney   When is this surgery scheduled? TBD   What type of clearance is required (medical clearance vs. Pharmacy clearance to hold med vs. Both)? Medical  Are there any medications that need to be held prior to surgery and how long?None   Practice name and name of physician performing surgery? [facility]   What is your office phone [phone]    7.   What is your office fax [phone]  8.   Anesthesia type (None, local, MAC, general) ? Not Specified   Roselie Skinner 06/06/2021, 4:29 PM  _________________________________________________________________   (provider comments below)

## 2021-06-08 NOTE — Telephone Encounter (Signed)
     Gabriel Cirri with [facility] pre-anesthesia department calling to f/u pre-op clearance, she said pt is scheduled to have procedure on 06/19/21

## 2021-06-08 NOTE — Telephone Encounter (Signed)
    Osborn Pullin DOB:  July 04, 1946  MRN:  [id number]   Primary Cardiologist: Dr. Lennox Pippins  Chart reviewed as part of pre-operative protocol coverage. Given past medical history and time since last visit, based on ACC/AHA guidelines, Maclovio Privett would be at acceptable risk for the planned procedure without further cardiovascular testing.   Patient recently seen by Dr. Lennox Pippins on 05/08/21. He continues to do well from a CV standpoint and can certainly perform 4 METS or greater without difficulty. He has had no chest pain or other anginal equivalent. He has no prior hx of stent placement or CABG.   The patient was advised that if he develops new symptoms prior to surgery to contact our office to arrange for a follow-up visit, and he verbalized understanding.  I will route this recommendation to the requesting party via Epic fax function and remove from pre-op pool.  Please call with questions.  Kathyrn Drown, NP 06/08/2021, 10:34 AM

## 2021-06-13 NOTE — Telephone Encounter (Signed)
Lucas Dunn with [facility] is following up. She states they never received a recommendation from our office. She would like to know if it can be resubmitted. She provided 2 fax numbers [phone] and [phone]) and requested that the fax be sent to both numbers.

## 2021-06-13 NOTE — Telephone Encounter (Signed)
Refaxed completed form to both numbers listed below as requested.  Darreld Mclean, PA-C 06/13/2021 1:34 PM

## 2021-06-19 DIAGNOSIS — Z8701 Personal history of pneumonia (recurrent): Secondary | ICD-10-CM | POA: Diagnosis not present

## 2021-06-19 DIAGNOSIS — Z9981 Dependence on supplemental oxygen: Secondary | ICD-10-CM | POA: Diagnosis not present

## 2021-06-19 DIAGNOSIS — Z20822 Contact with and (suspected) exposure to covid-19: Secondary | ICD-10-CM | POA: Diagnosis present

## 2021-06-19 DIAGNOSIS — N1832 Chronic kidney disease, stage 3b: Secondary | ICD-10-CM | POA: Diagnosis present

## 2021-06-19 DIAGNOSIS — C802 Malignant neoplasm associated with transplanted organ: Secondary | ICD-10-CM | POA: Diagnosis present

## 2021-06-19 DIAGNOSIS — I130 Hypertensive heart and chronic kidney disease with heart failure and stage 1 through stage 4 chronic kidney disease, or unspecified chronic kidney disease: Secondary | ICD-10-CM | POA: Diagnosis present

## 2021-06-19 DIAGNOSIS — D849 Immunodeficiency, unspecified: Secondary | ICD-10-CM | POA: Diagnosis not present

## 2021-06-19 DIAGNOSIS — I5032 Chronic diastolic (congestive) heart failure: Secondary | ICD-10-CM | POA: Diagnosis present

## 2021-06-19 DIAGNOSIS — C642 Malignant neoplasm of left kidney, except renal pelvis: Secondary | ICD-10-CM | POA: Diagnosis present

## 2021-06-19 DIAGNOSIS — I2510 Atherosclerotic heart disease of native coronary artery without angina pectoris: Secondary | ICD-10-CM | POA: Diagnosis present

## 2021-06-19 DIAGNOSIS — Z86711 Personal history of pulmonary embolism: Secondary | ICD-10-CM | POA: Diagnosis not present

## 2021-06-19 DIAGNOSIS — Z91048 Other nonmedicinal substance allergy status: Secondary | ICD-10-CM | POA: Diagnosis not present

## 2021-06-19 DIAGNOSIS — N2889 Other specified disorders of kidney and ureter: Secondary | ICD-10-CM | POA: Diagnosis present

## 2021-06-19 DIAGNOSIS — G8918 Other acute postprocedural pain: Secondary | ICD-10-CM | POA: Diagnosis not present

## 2021-06-19 DIAGNOSIS — H548 Legal blindness, as defined in USA: Secondary | ICD-10-CM | POA: Diagnosis present

## 2021-06-19 DIAGNOSIS — Z961 Presence of intraocular lens: Secondary | ICD-10-CM | POA: Diagnosis present

## 2021-06-19 DIAGNOSIS — N281 Cyst of kidney, acquired: Secondary | ICD-10-CM | POA: Diagnosis present

## 2021-06-19 DIAGNOSIS — Z794 Long term (current) use of insulin: Secondary | ICD-10-CM | POA: Diagnosis not present

## 2021-06-19 DIAGNOSIS — D84821 Immunodeficiency due to drugs: Secondary | ICD-10-CM | POA: Diagnosis present

## 2021-06-19 DIAGNOSIS — T8619 Other complication of kidney transplant: Secondary | ICD-10-CM | POA: Diagnosis present

## 2021-06-19 DIAGNOSIS — Z87891 Personal history of nicotine dependence: Secondary | ICD-10-CM | POA: Diagnosis not present

## 2021-06-19 DIAGNOSIS — Z881 Allergy status to other antibiotic agents status: Secondary | ICD-10-CM | POA: Diagnosis not present

## 2021-06-19 DIAGNOSIS — E1022 Type 1 diabetes mellitus with diabetic chronic kidney disease: Secondary | ICD-10-CM | POA: Diagnosis present

## 2021-06-19 DIAGNOSIS — Z9483 Pancreas transplant status: Secondary | ICD-10-CM | POA: Diagnosis not present

## 2021-06-19 DIAGNOSIS — E103591 Type 1 diabetes mellitus with proliferative diabetic retinopathy without macular edema, right eye: Secondary | ICD-10-CM | POA: Diagnosis present

## 2021-06-19 DIAGNOSIS — J8410 Pulmonary fibrosis, unspecified: Secondary | ICD-10-CM | POA: Diagnosis present

## 2021-06-19 DIAGNOSIS — Z940 Kidney transplant status: Secondary | ICD-10-CM | POA: Diagnosis not present

## 2021-06-19 DIAGNOSIS — U099 Post covid-19 condition, unspecified: Secondary | ICD-10-CM | POA: Diagnosis present

## 2021-06-20 DIAGNOSIS — G8918 Other acute postprocedural pain: Secondary | ICD-10-CM | POA: Diagnosis not present

## 2021-06-21 DIAGNOSIS — C642 Malignant neoplasm of left kidney, except renal pelvis: Secondary | ICD-10-CM | POA: Diagnosis not present

## 2021-06-21 DIAGNOSIS — G8918 Other acute postprocedural pain: Secondary | ICD-10-CM | POA: Diagnosis not present

## 2021-06-22 DIAGNOSIS — C642 Malignant neoplasm of left kidney, except renal pelvis: Secondary | ICD-10-CM | POA: Diagnosis not present

## 2021-06-23 DIAGNOSIS — Z940 Kidney transplant status: Secondary | ICD-10-CM | POA: Diagnosis not present

## 2021-06-23 DIAGNOSIS — D849 Immunodeficiency, unspecified: Secondary | ICD-10-CM | POA: Diagnosis not present

## 2021-06-27 DIAGNOSIS — G4733 Obstructive sleep apnea (adult) (pediatric): Secondary | ICD-10-CM | POA: Diagnosis not present

## 2021-06-27 DIAGNOSIS — G4736 Sleep related hypoventilation in conditions classified elsewhere: Secondary | ICD-10-CM | POA: Diagnosis not present

## 2021-06-29 DIAGNOSIS — E782 Mixed hyperlipidemia: Secondary | ICD-10-CM | POA: Diagnosis not present

## 2021-06-29 DIAGNOSIS — G4734 Idiopathic sleep related nonobstructive alveolar hypoventilation: Secondary | ICD-10-CM | POA: Diagnosis not present

## 2021-06-29 DIAGNOSIS — E1040 Type 1 diabetes mellitus with diabetic neuropathy, unspecified: Secondary | ICD-10-CM | POA: Diagnosis not present

## 2021-06-29 DIAGNOSIS — N2889 Other specified disorders of kidney and ureter: Secondary | ICD-10-CM | POA: Diagnosis not present

## 2021-06-29 DIAGNOSIS — H548 Legal blindness, as defined in USA: Secondary | ICD-10-CM | POA: Diagnosis not present

## 2021-06-29 DIAGNOSIS — Z7952 Long term (current) use of systemic steroids: Secondary | ICD-10-CM | POA: Diagnosis not present

## 2021-06-29 DIAGNOSIS — Z86711 Personal history of pulmonary embolism: Secondary | ICD-10-CM | POA: Diagnosis not present

## 2021-06-29 DIAGNOSIS — N1831 Chronic kidney disease, stage 3a: Secondary | ICD-10-CM | POA: Diagnosis not present

## 2021-06-29 DIAGNOSIS — E103513 Type 1 diabetes mellitus with proliferative diabetic retinopathy with macular edema, bilateral: Secondary | ICD-10-CM | POA: Diagnosis not present

## 2021-06-29 DIAGNOSIS — E1022 Type 1 diabetes mellitus with diabetic chronic kidney disease: Secondary | ICD-10-CM | POA: Diagnosis not present

## 2021-06-29 DIAGNOSIS — D849 Immunodeficiency, unspecified: Secondary | ICD-10-CM | POA: Diagnosis not present

## 2021-06-29 DIAGNOSIS — Z87891 Personal history of nicotine dependence: Secondary | ICD-10-CM | POA: Diagnosis not present

## 2021-06-29 DIAGNOSIS — Z8616 Personal history of COVID-19: Secondary | ICD-10-CM | POA: Diagnosis not present

## 2021-06-29 DIAGNOSIS — H53002 Unspecified amblyopia, left eye: Secondary | ICD-10-CM | POA: Diagnosis not present

## 2021-06-29 DIAGNOSIS — I5032 Chronic diastolic (congestive) heart failure: Secondary | ICD-10-CM | POA: Diagnosis not present

## 2021-06-29 DIAGNOSIS — G8929 Other chronic pain: Secondary | ICD-10-CM | POA: Diagnosis not present

## 2021-06-29 DIAGNOSIS — M8580 Other specified disorders of bone density and structure, unspecified site: Secondary | ICD-10-CM | POA: Diagnosis not present

## 2021-06-29 DIAGNOSIS — I2510 Atherosclerotic heart disease of native coronary artery without angina pectoris: Secondary | ICD-10-CM | POA: Diagnosis not present

## 2021-06-29 DIAGNOSIS — H509 Unspecified strabismus: Secondary | ICD-10-CM | POA: Diagnosis not present

## 2021-06-29 DIAGNOSIS — D631 Anemia in chronic kidney disease: Secondary | ICD-10-CM | POA: Diagnosis not present

## 2021-06-29 DIAGNOSIS — Z4822 Encounter for aftercare following kidney transplant: Secondary | ICD-10-CM | POA: Diagnosis not present

## 2021-06-29 DIAGNOSIS — H2512 Age-related nuclear cataract, left eye: Secondary | ICD-10-CM | POA: Diagnosis not present

## 2021-06-29 DIAGNOSIS — I130 Hypertensive heart and chronic kidney disease with heart failure and stage 1 through stage 4 chronic kidney disease, or unspecified chronic kidney disease: Secondary | ICD-10-CM | POA: Diagnosis not present

## 2021-06-29 DIAGNOSIS — D689 Coagulation defect, unspecified: Secondary | ICD-10-CM | POA: Diagnosis not present

## 2021-06-29 DIAGNOSIS — I059 Rheumatic mitral valve disease, unspecified: Secondary | ICD-10-CM | POA: Diagnosis not present

## 2021-06-30 DIAGNOSIS — G4736 Sleep related hypoventilation in conditions classified elsewhere: Secondary | ICD-10-CM | POA: Diagnosis not present

## 2021-06-30 DIAGNOSIS — J9611 Chronic respiratory failure with hypoxia: Secondary | ICD-10-CM | POA: Diagnosis not present

## 2021-06-30 DIAGNOSIS — J84112 Idiopathic pulmonary fibrosis: Secondary | ICD-10-CM | POA: Diagnosis not present

## 2021-07-02 DIAGNOSIS — Z20822 Contact with and (suspected) exposure to covid-19: Secondary | ICD-10-CM | POA: Diagnosis not present

## 2021-07-02 DIAGNOSIS — N2889 Other specified disorders of kidney and ureter: Secondary | ICD-10-CM | POA: Diagnosis not present

## 2021-07-02 DIAGNOSIS — I5032 Chronic diastolic (congestive) heart failure: Secondary | ICD-10-CM | POA: Diagnosis not present

## 2021-07-02 DIAGNOSIS — G4734 Idiopathic sleep related nonobstructive alveolar hypoventilation: Secondary | ICD-10-CM | POA: Diagnosis not present

## 2021-07-02 DIAGNOSIS — I130 Hypertensive heart and chronic kidney disease with heart failure and stage 1 through stage 4 chronic kidney disease, or unspecified chronic kidney disease: Secondary | ICD-10-CM | POA: Diagnosis not present

## 2021-07-02 DIAGNOSIS — Z4822 Encounter for aftercare following kidney transplant: Secondary | ICD-10-CM | POA: Diagnosis not present

## 2021-07-02 DIAGNOSIS — E1022 Type 1 diabetes mellitus with diabetic chronic kidney disease: Secondary | ICD-10-CM | POA: Diagnosis not present

## 2021-07-04 DIAGNOSIS — G4734 Idiopathic sleep related nonobstructive alveolar hypoventilation: Secondary | ICD-10-CM | POA: Diagnosis not present

## 2021-07-04 DIAGNOSIS — I130 Hypertensive heart and chronic kidney disease with heart failure and stage 1 through stage 4 chronic kidney disease, or unspecified chronic kidney disease: Secondary | ICD-10-CM | POA: Diagnosis not present

## 2021-07-04 DIAGNOSIS — Z4822 Encounter for aftercare following kidney transplant: Secondary | ICD-10-CM | POA: Diagnosis not present

## 2021-07-04 DIAGNOSIS — E1022 Type 1 diabetes mellitus with diabetic chronic kidney disease: Secondary | ICD-10-CM | POA: Diagnosis not present

## 2021-07-04 DIAGNOSIS — N2889 Other specified disorders of kidney and ureter: Secondary | ICD-10-CM | POA: Diagnosis not present

## 2021-07-04 DIAGNOSIS — I5032 Chronic diastolic (congestive) heart failure: Secondary | ICD-10-CM | POA: Diagnosis not present

## 2021-07-05 DIAGNOSIS — Z905 Acquired absence of kidney: Secondary | ICD-10-CM | POA: Diagnosis not present

## 2021-07-05 DIAGNOSIS — C642 Malignant neoplasm of left kidney, except renal pelvis: Secondary | ICD-10-CM | POA: Diagnosis not present

## 2021-07-05 DIAGNOSIS — T8619 Other complication of kidney transplant: Secondary | ICD-10-CM | POA: Diagnosis not present

## 2021-07-05 DIAGNOSIS — C641 Malignant neoplasm of right kidney, except renal pelvis: Secondary | ICD-10-CM | POA: Diagnosis not present

## 2021-07-05 DIAGNOSIS — Z87891 Personal history of nicotine dependence: Secondary | ICD-10-CM | POA: Diagnosis not present

## 2021-07-05 DIAGNOSIS — C802 Malignant neoplasm associated with transplanted organ: Secondary | ICD-10-CM | POA: Diagnosis not present

## 2021-07-06 DIAGNOSIS — I10 Essential (primary) hypertension: Secondary | ICD-10-CM | POA: Diagnosis not present

## 2021-07-06 DIAGNOSIS — Z940 Kidney transplant status: Secondary | ICD-10-CM | POA: Diagnosis not present

## 2021-07-06 DIAGNOSIS — D849 Immunodeficiency, unspecified: Secondary | ICD-10-CM | POA: Diagnosis not present

## 2021-07-06 DIAGNOSIS — N1832 Chronic kidney disease, stage 3b: Secondary | ICD-10-CM | POA: Diagnosis not present

## 2021-07-06 DIAGNOSIS — E1022 Type 1 diabetes mellitus with diabetic chronic kidney disease: Secondary | ICD-10-CM | POA: Diagnosis not present

## 2021-07-06 DIAGNOSIS — N1831 Chronic kidney disease, stage 3a: Secondary | ICD-10-CM | POA: Diagnosis not present

## 2021-07-10 DIAGNOSIS — G4734 Idiopathic sleep related nonobstructive alveolar hypoventilation: Secondary | ICD-10-CM | POA: Diagnosis not present

## 2021-07-10 DIAGNOSIS — N2889 Other specified disorders of kidney and ureter: Secondary | ICD-10-CM | POA: Diagnosis not present

## 2021-07-10 DIAGNOSIS — I5032 Chronic diastolic (congestive) heart failure: Secondary | ICD-10-CM | POA: Diagnosis not present

## 2021-07-10 DIAGNOSIS — E1022 Type 1 diabetes mellitus with diabetic chronic kidney disease: Secondary | ICD-10-CM | POA: Diagnosis not present

## 2021-07-10 DIAGNOSIS — Z4822 Encounter for aftercare following kidney transplant: Secondary | ICD-10-CM | POA: Diagnosis not present

## 2021-07-10 DIAGNOSIS — I130 Hypertensive heart and chronic kidney disease with heart failure and stage 1 through stage 4 chronic kidney disease, or unspecified chronic kidney disease: Secondary | ICD-10-CM | POA: Diagnosis not present

## 2021-07-12 DIAGNOSIS — G4734 Idiopathic sleep related nonobstructive alveolar hypoventilation: Secondary | ICD-10-CM | POA: Diagnosis not present

## 2021-07-12 DIAGNOSIS — E1022 Type 1 diabetes mellitus with diabetic chronic kidney disease: Secondary | ICD-10-CM | POA: Diagnosis not present

## 2021-07-12 DIAGNOSIS — N2889 Other specified disorders of kidney and ureter: Secondary | ICD-10-CM | POA: Diagnosis not present

## 2021-07-12 DIAGNOSIS — I130 Hypertensive heart and chronic kidney disease with heart failure and stage 1 through stage 4 chronic kidney disease, or unspecified chronic kidney disease: Secondary | ICD-10-CM | POA: Diagnosis not present

## 2021-07-12 DIAGNOSIS — I5032 Chronic diastolic (congestive) heart failure: Secondary | ICD-10-CM | POA: Diagnosis not present

## 2021-07-12 DIAGNOSIS — Z4822 Encounter for aftercare following kidney transplant: Secondary | ICD-10-CM | POA: Diagnosis not present

## 2021-07-16 DIAGNOSIS — I5032 Chronic diastolic (congestive) heart failure: Secondary | ICD-10-CM | POA: Diagnosis not present

## 2021-07-16 DIAGNOSIS — G4734 Idiopathic sleep related nonobstructive alveolar hypoventilation: Secondary | ICD-10-CM | POA: Diagnosis not present

## 2021-07-16 DIAGNOSIS — E1022 Type 1 diabetes mellitus with diabetic chronic kidney disease: Secondary | ICD-10-CM | POA: Diagnosis not present

## 2021-07-16 DIAGNOSIS — I130 Hypertensive heart and chronic kidney disease with heart failure and stage 1 through stage 4 chronic kidney disease, or unspecified chronic kidney disease: Secondary | ICD-10-CM | POA: Diagnosis not present

## 2021-07-16 DIAGNOSIS — N2889 Other specified disorders of kidney and ureter: Secondary | ICD-10-CM | POA: Diagnosis not present

## 2021-07-16 DIAGNOSIS — Z4822 Encounter for aftercare following kidney transplant: Secondary | ICD-10-CM | POA: Diagnosis not present

## 2021-07-20 DIAGNOSIS — I5032 Chronic diastolic (congestive) heart failure: Secondary | ICD-10-CM | POA: Diagnosis not present

## 2021-07-20 DIAGNOSIS — N2889 Other specified disorders of kidney and ureter: Secondary | ICD-10-CM | POA: Diagnosis not present

## 2021-07-20 DIAGNOSIS — E1022 Type 1 diabetes mellitus with diabetic chronic kidney disease: Secondary | ICD-10-CM | POA: Diagnosis not present

## 2021-07-20 DIAGNOSIS — I130 Hypertensive heart and chronic kidney disease with heart failure and stage 1 through stage 4 chronic kidney disease, or unspecified chronic kidney disease: Secondary | ICD-10-CM | POA: Diagnosis not present

## 2021-07-20 DIAGNOSIS — G4734 Idiopathic sleep related nonobstructive alveolar hypoventilation: Secondary | ICD-10-CM | POA: Diagnosis not present

## 2021-07-20 DIAGNOSIS — Z4822 Encounter for aftercare following kidney transplant: Secondary | ICD-10-CM | POA: Diagnosis not present

## 2021-07-24 DIAGNOSIS — G4736 Sleep related hypoventilation in conditions classified elsewhere: Secondary | ICD-10-CM | POA: Diagnosis not present

## 2021-07-24 DIAGNOSIS — G4733 Obstructive sleep apnea (adult) (pediatric): Secondary | ICD-10-CM | POA: Diagnosis not present

## 2021-07-26 DIAGNOSIS — G4734 Idiopathic sleep related nonobstructive alveolar hypoventilation: Secondary | ICD-10-CM | POA: Diagnosis not present

## 2021-07-26 DIAGNOSIS — Z4822 Encounter for aftercare following kidney transplant: Secondary | ICD-10-CM | POA: Diagnosis not present

## 2021-07-26 DIAGNOSIS — N2889 Other specified disorders of kidney and ureter: Secondary | ICD-10-CM | POA: Diagnosis not present

## 2021-07-26 DIAGNOSIS — E1022 Type 1 diabetes mellitus with diabetic chronic kidney disease: Secondary | ICD-10-CM | POA: Diagnosis not present

## 2021-07-26 DIAGNOSIS — I130 Hypertensive heart and chronic kidney disease with heart failure and stage 1 through stage 4 chronic kidney disease, or unspecified chronic kidney disease: Secondary | ICD-10-CM | POA: Diagnosis not present

## 2021-07-26 DIAGNOSIS — I5032 Chronic diastolic (congestive) heart failure: Secondary | ICD-10-CM | POA: Diagnosis not present

## 2021-07-29 DIAGNOSIS — E103513 Type 1 diabetes mellitus with proliferative diabetic retinopathy with macular edema, bilateral: Secondary | ICD-10-CM | POA: Diagnosis not present

## 2021-07-29 DIAGNOSIS — D631 Anemia in chronic kidney disease: Secondary | ICD-10-CM | POA: Diagnosis not present

## 2021-07-29 DIAGNOSIS — D689 Coagulation defect, unspecified: Secondary | ICD-10-CM | POA: Diagnosis not present

## 2021-07-29 DIAGNOSIS — I5032 Chronic diastolic (congestive) heart failure: Secondary | ICD-10-CM | POA: Diagnosis not present

## 2021-07-29 DIAGNOSIS — H2512 Age-related nuclear cataract, left eye: Secondary | ICD-10-CM | POA: Diagnosis not present

## 2021-07-29 DIAGNOSIS — N1831 Chronic kidney disease, stage 3a: Secondary | ICD-10-CM | POA: Diagnosis not present

## 2021-07-29 DIAGNOSIS — Z86711 Personal history of pulmonary embolism: Secondary | ICD-10-CM | POA: Diagnosis not present

## 2021-07-29 DIAGNOSIS — D849 Immunodeficiency, unspecified: Secondary | ICD-10-CM | POA: Diagnosis not present

## 2021-07-29 DIAGNOSIS — H53002 Unspecified amblyopia, left eye: Secondary | ICD-10-CM | POA: Diagnosis not present

## 2021-07-29 DIAGNOSIS — G4734 Idiopathic sleep related nonobstructive alveolar hypoventilation: Secondary | ICD-10-CM | POA: Diagnosis not present

## 2021-07-29 DIAGNOSIS — H509 Unspecified strabismus: Secondary | ICD-10-CM | POA: Diagnosis not present

## 2021-07-29 DIAGNOSIS — E1040 Type 1 diabetes mellitus with diabetic neuropathy, unspecified: Secondary | ICD-10-CM | POA: Diagnosis not present

## 2021-07-29 DIAGNOSIS — Z4822 Encounter for aftercare following kidney transplant: Secondary | ICD-10-CM | POA: Diagnosis not present

## 2021-07-29 DIAGNOSIS — I2510 Atherosclerotic heart disease of native coronary artery without angina pectoris: Secondary | ICD-10-CM | POA: Diagnosis not present

## 2021-07-29 DIAGNOSIS — Z87891 Personal history of nicotine dependence: Secondary | ICD-10-CM | POA: Diagnosis not present

## 2021-07-29 DIAGNOSIS — H548 Legal blindness, as defined in USA: Secondary | ICD-10-CM | POA: Diagnosis not present

## 2021-07-29 DIAGNOSIS — I130 Hypertensive heart and chronic kidney disease with heart failure and stage 1 through stage 4 chronic kidney disease, or unspecified chronic kidney disease: Secondary | ICD-10-CM | POA: Diagnosis not present

## 2021-07-29 DIAGNOSIS — G8929 Other chronic pain: Secondary | ICD-10-CM | POA: Diagnosis not present

## 2021-07-29 DIAGNOSIS — E782 Mixed hyperlipidemia: Secondary | ICD-10-CM | POA: Diagnosis not present

## 2021-07-29 DIAGNOSIS — Z8616 Personal history of COVID-19: Secondary | ICD-10-CM | POA: Diagnosis not present

## 2021-07-29 DIAGNOSIS — Z7952 Long term (current) use of systemic steroids: Secondary | ICD-10-CM | POA: Diagnosis not present

## 2021-07-29 DIAGNOSIS — N2889 Other specified disorders of kidney and ureter: Secondary | ICD-10-CM | POA: Diagnosis not present

## 2021-07-29 DIAGNOSIS — M8580 Other specified disorders of bone density and structure, unspecified site: Secondary | ICD-10-CM | POA: Diagnosis not present

## 2021-07-29 DIAGNOSIS — I059 Rheumatic mitral valve disease, unspecified: Secondary | ICD-10-CM | POA: Diagnosis not present

## 2021-07-29 DIAGNOSIS — E1022 Type 1 diabetes mellitus with diabetic chronic kidney disease: Secondary | ICD-10-CM | POA: Diagnosis not present

## 2021-07-31 DIAGNOSIS — J84112 Idiopathic pulmonary fibrosis: Secondary | ICD-10-CM | POA: Diagnosis not present

## 2021-07-31 DIAGNOSIS — G4736 Sleep related hypoventilation in conditions classified elsewhere: Secondary | ICD-10-CM | POA: Diagnosis not present

## 2021-07-31 DIAGNOSIS — J9611 Chronic respiratory failure with hypoxia: Secondary | ICD-10-CM | POA: Diagnosis not present

## 2021-08-01 DIAGNOSIS — G4734 Idiopathic sleep related nonobstructive alveolar hypoventilation: Secondary | ICD-10-CM | POA: Diagnosis not present

## 2021-08-01 DIAGNOSIS — N2889 Other specified disorders of kidney and ureter: Secondary | ICD-10-CM | POA: Diagnosis not present

## 2021-08-01 DIAGNOSIS — I5032 Chronic diastolic (congestive) heart failure: Secondary | ICD-10-CM | POA: Diagnosis not present

## 2021-08-01 DIAGNOSIS — I130 Hypertensive heart and chronic kidney disease with heart failure and stage 1 through stage 4 chronic kidney disease, or unspecified chronic kidney disease: Secondary | ICD-10-CM | POA: Diagnosis not present

## 2021-08-01 DIAGNOSIS — Z4822 Encounter for aftercare following kidney transplant: Secondary | ICD-10-CM | POA: Diagnosis not present

## 2021-08-01 DIAGNOSIS — E1022 Type 1 diabetes mellitus with diabetic chronic kidney disease: Secondary | ICD-10-CM | POA: Diagnosis not present

## 2021-08-07 DIAGNOSIS — Z4822 Encounter for aftercare following kidney transplant: Secondary | ICD-10-CM | POA: Diagnosis not present

## 2021-08-07 DIAGNOSIS — I130 Hypertensive heart and chronic kidney disease with heart failure and stage 1 through stage 4 chronic kidney disease, or unspecified chronic kidney disease: Secondary | ICD-10-CM | POA: Diagnosis not present

## 2021-08-07 DIAGNOSIS — N2889 Other specified disorders of kidney and ureter: Secondary | ICD-10-CM | POA: Diagnosis not present

## 2021-08-07 DIAGNOSIS — G4734 Idiopathic sleep related nonobstructive alveolar hypoventilation: Secondary | ICD-10-CM | POA: Diagnosis not present

## 2021-08-07 DIAGNOSIS — E1022 Type 1 diabetes mellitus with diabetic chronic kidney disease: Secondary | ICD-10-CM | POA: Diagnosis not present

## 2021-08-07 DIAGNOSIS — I5032 Chronic diastolic (congestive) heart failure: Secondary | ICD-10-CM | POA: Diagnosis not present

## 2021-08-14 DIAGNOSIS — I130 Hypertensive heart and chronic kidney disease with heart failure and stage 1 through stage 4 chronic kidney disease, or unspecified chronic kidney disease: Secondary | ICD-10-CM | POA: Diagnosis not present

## 2021-08-14 DIAGNOSIS — E1022 Type 1 diabetes mellitus with diabetic chronic kidney disease: Secondary | ICD-10-CM | POA: Diagnosis not present

## 2021-08-14 DIAGNOSIS — I5032 Chronic diastolic (congestive) heart failure: Secondary | ICD-10-CM | POA: Diagnosis not present

## 2021-08-14 DIAGNOSIS — Z4822 Encounter for aftercare following kidney transplant: Secondary | ICD-10-CM | POA: Diagnosis not present

## 2021-08-14 DIAGNOSIS — N2889 Other specified disorders of kidney and ureter: Secondary | ICD-10-CM | POA: Diagnosis not present

## 2021-08-14 DIAGNOSIS — G4734 Idiopathic sleep related nonobstructive alveolar hypoventilation: Secondary | ICD-10-CM | POA: Diagnosis not present

## 2021-08-16 DIAGNOSIS — Z9181 History of falling: Secondary | ICD-10-CM | POA: Diagnosis not present

## 2021-08-16 DIAGNOSIS — Z1331 Encounter for screening for depression: Secondary | ICD-10-CM | POA: Diagnosis not present

## 2021-08-16 DIAGNOSIS — Z0000 Encounter for general adult medical examination without abnormal findings: Secondary | ICD-10-CM | POA: Diagnosis not present

## 2021-08-16 DIAGNOSIS — E785 Hyperlipidemia, unspecified: Secondary | ICD-10-CM | POA: Diagnosis not present

## 2021-08-17 DIAGNOSIS — N189 Chronic kidney disease, unspecified: Secondary | ICD-10-CM | POA: Diagnosis not present

## 2021-08-17 DIAGNOSIS — Z6825 Body mass index (BMI) 25.0-25.9, adult: Secondary | ICD-10-CM | POA: Diagnosis not present

## 2021-08-17 DIAGNOSIS — I129 Hypertensive chronic kidney disease with stage 1 through stage 4 chronic kidney disease, or unspecified chronic kidney disease: Secondary | ICD-10-CM | POA: Diagnosis not present

## 2021-08-17 DIAGNOSIS — E10319 Type 1 diabetes mellitus with unspecified diabetic retinopathy without macular edema: Secondary | ICD-10-CM | POA: Diagnosis not present

## 2021-08-17 DIAGNOSIS — Z79899 Other long term (current) drug therapy: Secondary | ICD-10-CM | POA: Diagnosis not present

## 2021-08-17 DIAGNOSIS — E785 Hyperlipidemia, unspecified: Secondary | ICD-10-CM | POA: Diagnosis not present

## 2021-08-17 DIAGNOSIS — I5032 Chronic diastolic (congestive) heart failure: Secondary | ICD-10-CM | POA: Diagnosis not present

## 2021-08-23 DIAGNOSIS — I5032 Chronic diastolic (congestive) heart failure: Secondary | ICD-10-CM | POA: Diagnosis not present

## 2021-08-23 DIAGNOSIS — G4734 Idiopathic sleep related nonobstructive alveolar hypoventilation: Secondary | ICD-10-CM | POA: Diagnosis not present

## 2021-08-23 DIAGNOSIS — E1022 Type 1 diabetes mellitus with diabetic chronic kidney disease: Secondary | ICD-10-CM | POA: Diagnosis not present

## 2021-08-23 DIAGNOSIS — N2889 Other specified disorders of kidney and ureter: Secondary | ICD-10-CM | POA: Diagnosis not present

## 2021-08-23 DIAGNOSIS — I130 Hypertensive heart and chronic kidney disease with heart failure and stage 1 through stage 4 chronic kidney disease, or unspecified chronic kidney disease: Secondary | ICD-10-CM | POA: Diagnosis not present

## 2021-08-23 DIAGNOSIS — Z4822 Encounter for aftercare following kidney transplant: Secondary | ICD-10-CM | POA: Diagnosis not present

## 2021-10-02 DIAGNOSIS — L03114 Cellulitis of left upper limb: Secondary | ICD-10-CM | POA: Diagnosis not present

## 2021-10-04 DIAGNOSIS — M109 Gout, unspecified: Secondary | ICD-10-CM | POA: Diagnosis not present

## 2021-10-04 DIAGNOSIS — L03114 Cellulitis of left upper limb: Secondary | ICD-10-CM | POA: Diagnosis not present

## 2021-10-04 DIAGNOSIS — M7989 Other specified soft tissue disorders: Secondary | ICD-10-CM | POA: Diagnosis not present

## 2021-10-04 DIAGNOSIS — M25532 Pain in left wrist: Secondary | ICD-10-CM | POA: Diagnosis not present

## 2021-10-04 DIAGNOSIS — M79642 Pain in left hand: Secondary | ICD-10-CM | POA: Diagnosis not present

## 2021-10-04 DIAGNOSIS — Z9889 Other specified postprocedural states: Secondary | ICD-10-CM | POA: Diagnosis not present

## 2021-10-04 DIAGNOSIS — M25522 Pain in left elbow: Secondary | ICD-10-CM | POA: Diagnosis not present

## 2021-10-04 DIAGNOSIS — Z23 Encounter for immunization: Secondary | ICD-10-CM | POA: Diagnosis not present

## 2021-10-04 DIAGNOSIS — M25422 Effusion, left elbow: Secondary | ICD-10-CM | POA: Diagnosis not present

## 2021-10-31 DIAGNOSIS — J84112 Idiopathic pulmonary fibrosis: Secondary | ICD-10-CM | POA: Diagnosis not present

## 2021-10-31 DIAGNOSIS — G4736 Sleep related hypoventilation in conditions classified elsewhere: Secondary | ICD-10-CM | POA: Diagnosis not present

## 2021-10-31 DIAGNOSIS — J9611 Chronic respiratory failure with hypoxia: Secondary | ICD-10-CM | POA: Diagnosis not present

## 2021-11-13 DIAGNOSIS — M7701 Medial epicondylitis, right elbow: Secondary | ICD-10-CM | POA: Diagnosis not present

## 2021-11-14 DIAGNOSIS — Z6824 Body mass index (BMI) 24.0-24.9, adult: Secondary | ICD-10-CM | POA: Diagnosis not present

## 2021-11-14 DIAGNOSIS — M7711 Lateral epicondylitis, right elbow: Secondary | ICD-10-CM | POA: Diagnosis not present

## 2021-11-14 DIAGNOSIS — E10319 Type 1 diabetes mellitus with unspecified diabetic retinopathy without macular edema: Secondary | ICD-10-CM | POA: Diagnosis not present

## 2021-11-22 DIAGNOSIS — M109 Gout, unspecified: Secondary | ICD-10-CM | POA: Diagnosis not present

## 2021-11-22 DIAGNOSIS — Z6825 Body mass index (BMI) 25.0-25.9, adult: Secondary | ICD-10-CM | POA: Diagnosis not present

## 2021-11-22 DIAGNOSIS — E10319 Type 1 diabetes mellitus with unspecified diabetic retinopathy without macular edema: Secondary | ICD-10-CM | POA: Diagnosis not present

## 2021-11-22 DIAGNOSIS — N189 Chronic kidney disease, unspecified: Secondary | ICD-10-CM | POA: Diagnosis not present

## 2021-11-22 DIAGNOSIS — E785 Hyperlipidemia, unspecified: Secondary | ICD-10-CM | POA: Diagnosis not present

## 2021-11-22 DIAGNOSIS — I129 Hypertensive chronic kidney disease with stage 1 through stage 4 chronic kidney disease, or unspecified chronic kidney disease: Secondary | ICD-10-CM | POA: Diagnosis not present

## 2021-11-22 DIAGNOSIS — I5032 Chronic diastolic (congestive) heart failure: Secondary | ICD-10-CM | POA: Diagnosis not present

## 2021-11-29 ENCOUNTER — Other Ambulatory Visit: Payer: Self-pay

## 2021-11-29 ENCOUNTER — Encounter: Payer: Self-pay | Admitting: Cardiology

## 2021-11-29 ENCOUNTER — Ambulatory Visit (INDEPENDENT_AMBULATORY_CARE_PROVIDER_SITE_OTHER): Payer: Medicare Other | Admitting: Cardiology

## 2021-11-29 VITALS — BP 138/80 | HR 71 | Ht 66.0 in | Wt 155.8 lb

## 2021-11-29 DIAGNOSIS — U071 COVID-19: Secondary | ICD-10-CM

## 2021-11-29 DIAGNOSIS — I2699 Other pulmonary embolism without acute cor pulmonale: Secondary | ICD-10-CM

## 2021-11-29 DIAGNOSIS — I5032 Chronic diastolic (congestive) heart failure: Secondary | ICD-10-CM

## 2021-11-29 DIAGNOSIS — I2510 Atherosclerotic heart disease of native coronary artery without angina pectoris: Secondary | ICD-10-CM

## 2021-11-29 DIAGNOSIS — E782 Mixed hyperlipidemia: Secondary | ICD-10-CM

## 2021-11-29 DIAGNOSIS — I700 Atherosclerosis of aorta: Secondary | ICD-10-CM | POA: Diagnosis not present

## 2021-11-29 DIAGNOSIS — I10 Essential (primary) hypertension: Secondary | ICD-10-CM

## 2021-11-29 NOTE — Progress Notes (Signed)
Cardiology Office Note:    Date:  11/29/2021   ID:  Lucas Dunn, DOB 03/07/1946, MRN [id number]  PCP:  Nicoletta Dress, MD  Cardiologist:  Jenean Lindau, MD   Referring MD: Nicoletta Dress, MD    ASSESSMENT:    1. Pulmonary embolism associated with COVID-19 ([place])   2. Essential hypertension   3. CHF (congestive heart failure), NYHA class II, chronic, diastolic ([place])   4. Coronary artery disease involving native coronary artery of native heart without angina pectoris   5. Aortic atherosclerosis ([place])   6. Mixed dyslipidemia    PLAN:    In order of problems listed above:  Coronary artery disease: Secondary prevention stressed with the patient.  Importance of compliance with diet medication stressed any vocalized understanding.  He was advised to walk at least half an hour a day 5 days a week and he promises to do so. Essential hypertension: Blood pressure stable and diet was emphasized.  Lifestyle modification urged. Mixed dyslipidemia: On statin therapy and lipids were reviewed from [place] and discussed with the patient. Post renal transplant.  Stable at this time and followed by nephrology. Elevated hemoglobin A1c: Diabetes mellitus: Follow-up by primary care.  Diet emphasized. Patient will be seen in follow-up appointment in 6 months or earlier if the patient has any concerns    Medication Adjustments/Labs and Tests Ordered: Current medicines are reviewed at length with the patient today.  Concerns regarding medicines are outlined above.  No orders of the defined types were placed in this encounter.  No orders of the defined types were placed in this encounter.    No chief complaint on file.    History of Present Illness:    Lucas Dunn is a 75 y.o. male.  Patient has past medical history of coronary artery disease, aortic atherosclerosis, essential hypertension, mixed dyslipidemia and post renal transplant.  He denies any problems at this time and takes  care of activities of daily living.  No chest pain orthopnea or PND.  At the time of my evaluation, the patient is alert awake oriented and in no distress.  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 Partial nephrectomy 06/19/2021 for RCC   CHF (congestive heart failure), NYHA class II, chronic, diastolic ([place]) 17/05/1606   Chronic pain of right hip 10/15/2014   Coagulopathy ([place]) 02/13/2020   COVID-19 virus detected 02/05/2020   Diabetic macular edema of both eyes ([place]) 10/10/2012   Essential hypertension 10/10/2012   Immunosuppression ([place]) 05/05/2018   Left renal mass 02/11/2020   Liver mass, right lobe 02/11/2020   Mitral valve annular calcification 11/24/2020   Mixed dyslipidemia 08/24/2020   Nocturnal hypoxemia 06/06/2021   Formatting of this note might be different from the original. Uses 2L via [place]   Nuclear sclerotic cataract of left eye 10/10/2012   Osteopenia 10/10/2012   Formatting of this note might be different from the original. A. Last BMD 09/2004.   PDR (proliferative diabetic retinopathy) ([place]) 10/10/2012   Formatting of this  note might be different from the original. S/P  PPV  OD   Pneumonia due to COVID-19 virus 02/11/2020   Pseudophakia of right eye 10/10/2012   Pulmonary embolism associated with COVID-19 ([place]) 02/11/2020   Renal mass of kidney transplant 02/11/2020   Right carotid bruit 10/10/2012   S/P kidney transplant 09/08/2000   Formatting of this note might be different from the original. A.  1B, 1DR match.       B. Kidney cold ischemia time 18 hours.       C. 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     D. Zenapax induction; 09/13/00 discharge creatinine 3.2.   E. 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   F. Negative renal transplant artery arteriogram 6/04.   Stage 3a chronic kidney disease ([place]) 02/11/2020   Type 1 diabetes mellitus ([place]) 10/14/2012   Formatting of this note might be different from the original. A. ESRD 10/99. B. Retinopathy C. Mild neuropathy.    Past Surgical History:  Procedure Laterality Date   COMBINED KIDNEY-PANCREAS TRANSPLANT     KIDNEY TRANSPLANT      Current Medications: Current Meds  Medication Sig   allopurinol (ZYLOPRIM) 100 MG tablet Take 100 mg by mouth daily.   amLODipine (NORVASC) 5 MG tablet Take 5 mg by mouth daily.   atorvastatin (LIPITOR) 20 MG tablet Take 20 mg by mouth daily.   Calcium Carbonate-Vitamin D 600-200 MG-UNIT TABS Take 1 tablet by mouth 2 (two) times daily with a meal.   famotidine (PEPCID) 20 MG tablet Take 20 mg by mouth as needed for heartburn or indigestion.   furosemide (LASIX) 20 MG tablet Take 40 mg by mouth every morning. And takes 20 mg in the evening   glimepiride (AMARYL) 1 MG tablet Take 1 mg by mouth every morning.   mycophenolate (CELLCEPT) 250 MG capsule Take 500 mg by mouth 2 (two) times daily.   predniSONE (DELTASONE) 5 MG tablet Take 5 mg by mouth daily.   ROCKLATAN 0.02-0.005 % SOLN Place 1 drop into the right eye at bedtime.   tacrolimus (PROGRAF) 0.5 MG capsule Take 0.5 mg by mouth. Take 2 tablets am and 1 tablet pm     Allergies:   Latex, Levofloxacin, and Other   Social History   Socioeconomic History   Marital status: Married    Spouse name: Not on file   Number of children: Not on file   Years of education: Not on file   Highest education level: Not on file  Occupational History   Not on file  Tobacco Use   Smoking status: Former   Smokeless tobacco: Never  Substance and Sexual Activity   Alcohol  use: Not on file   Drug use: Not on file   Sexual activity: Not on file  Other Topics Concern   Not on file  Social History Narrative   Not on file   Social Determinants of Health   Financial Resource Strain: Not on file  Food Insecurity: Not on file  Transportation Needs: Not on file  Physical Activity: Not on file  Stress: Not on file  Social Connections: Not on file     Family History: The patient's family history includes Diabetes in his brother; Heart disease in his father; Hypertension in his brother, father, and mother.  ROS:   Please see the history of present illness.    All other systems reviewed and are negative.  EKGs/Labs/Other Studies Reviewed:    The following studies were reviewed today: EKG reveals sinus  rhythm poor anterior forces and nonspecific ST-T changes   Recent Labs: No results found for requested labs within last 8760 hours.  Recent Lipid Panel No results found for: CHOL, TRIG, HDL, CHOLHDL, VLDL, LDLCALC, LDLDIRECT  Physical Exam:    VS:  BP 138/80   Pulse 71   Ht 5\' 6"  (1.676 m)   Wt 155 lb 12.8 oz (70.7 kg)   SpO2 94%   BMI 25.15 kg/m     Wt Readings from Last 3 Encounters:  11/29/21 155 lb 12.8 oz (70.7 kg)  05/08/21 159 lb 12.8 oz (72.5 kg)  02/02/21 171 lb 6.4 oz (77.7 kg)     GEN: Patient is in no acute distress HEENT: Normal NECK: No JVD; No carotid bruits LYMPHATICS: No lymphadenopathy CARDIAC: Hear sounds regular, 2/6 systolic murmur at the apex. RESPIRATORY:  Clear to auscultation without rales, wheezing or rhonchi  ABDOMEN: Soft, non-tender, non-distended MUSCULOSKELETAL:  No edema; No deformity  SKIN: Warm and dry NEUROLOGIC:  Alert and oriented x 3 PSYCHIATRIC:  Normal affect   Signed, Jenean Lindau, MD  11/29/2021 3:40 PM    [facility]

## 2021-11-29 NOTE — Patient Instructions (Signed)

## 2021-11-30 DIAGNOSIS — Z85528 Personal history of other malignant neoplasm of kidney: Secondary | ICD-10-CM | POA: Diagnosis not present

## 2021-11-30 DIAGNOSIS — J432 Centrilobular emphysema: Secondary | ICD-10-CM | POA: Diagnosis not present

## 2021-11-30 DIAGNOSIS — R0600 Dyspnea, unspecified: Secondary | ICD-10-CM | POA: Diagnosis not present

## 2021-11-30 DIAGNOSIS — J479 Bronchiectasis, uncomplicated: Secondary | ICD-10-CM | POA: Diagnosis not present

## 2021-11-30 DIAGNOSIS — I2510 Atherosclerotic heart disease of native coronary artery without angina pectoris: Secondary | ICD-10-CM | POA: Diagnosis not present

## 2021-12-04 DIAGNOSIS — G4736 Sleep related hypoventilation in conditions classified elsewhere: Secondary | ICD-10-CM | POA: Diagnosis not present

## 2021-12-04 DIAGNOSIS — J9611 Chronic respiratory failure with hypoxia: Secondary | ICD-10-CM | POA: Diagnosis not present

## 2021-12-04 DIAGNOSIS — J84112 Idiopathic pulmonary fibrosis: Secondary | ICD-10-CM | POA: Diagnosis not present

## 2021-12-18 DIAGNOSIS — D849 Immunodeficiency, unspecified: Secondary | ICD-10-CM | POA: Diagnosis not present

## 2021-12-18 DIAGNOSIS — U071 COVID-19: Secondary | ICD-10-CM | POA: Diagnosis not present

## 2021-12-18 DIAGNOSIS — R059 Cough, unspecified: Secondary | ICD-10-CM | POA: Diagnosis not present

## 2021-12-19 DIAGNOSIS — U071 COVID-19: Secondary | ICD-10-CM | POA: Diagnosis not present

## 2021-12-20 DIAGNOSIS — U071 COVID-19: Secondary | ICD-10-CM | POA: Diagnosis not present

## 2021-12-22 DIAGNOSIS — K6389 Other specified diseases of intestine: Secondary | ICD-10-CM | POA: Diagnosis not present

## 2021-12-22 DIAGNOSIS — C641 Malignant neoplasm of right kidney, except renal pelvis: Secondary | ICD-10-CM | POA: Diagnosis not present

## 2021-12-22 DIAGNOSIS — R197 Diarrhea, unspecified: Secondary | ICD-10-CM | POA: Diagnosis not present

## 2021-12-22 DIAGNOSIS — K5939 Other megacolon: Secondary | ICD-10-CM | POA: Diagnosis not present

## 2021-12-22 DIAGNOSIS — Z8701 Personal history of pneumonia (recurrent): Secondary | ICD-10-CM | POA: Diagnosis not present

## 2021-12-22 DIAGNOSIS — R109 Unspecified abdominal pain: Secondary | ICD-10-CM | POA: Diagnosis not present

## 2021-12-22 DIAGNOSIS — M1990 Unspecified osteoarthritis, unspecified site: Secondary | ICD-10-CM | POA: Diagnosis not present

## 2021-12-22 DIAGNOSIS — Z86711 Personal history of pulmonary embolism: Secondary | ICD-10-CM | POA: Diagnosis not present

## 2021-12-22 DIAGNOSIS — K4090 Unilateral inguinal hernia, without obstruction or gangrene, not specified as recurrent: Secondary | ICD-10-CM | POA: Diagnosis not present

## 2021-12-22 DIAGNOSIS — C642 Malignant neoplasm of left kidney, except renal pelvis: Secondary | ICD-10-CM | POA: Diagnosis not present

## 2021-12-22 DIAGNOSIS — I110 Hypertensive heart disease with heart failure: Secondary | ICD-10-CM | POA: Diagnosis not present

## 2021-12-22 DIAGNOSIS — T8619 Other complication of kidney transplant: Secondary | ICD-10-CM | POA: Diagnosis not present

## 2021-12-22 DIAGNOSIS — Z7952 Long term (current) use of systemic steroids: Secondary | ICD-10-CM | POA: Diagnosis not present

## 2021-12-22 DIAGNOSIS — Z9483 Pancreas transplant status: Secondary | ICD-10-CM | POA: Diagnosis not present

## 2021-12-22 DIAGNOSIS — K565 Intestinal adhesions [bands], unspecified as to partial versus complete obstruction: Secondary | ICD-10-CM | POA: Diagnosis not present

## 2021-12-22 DIAGNOSIS — E119 Type 2 diabetes mellitus without complications: Secondary | ICD-10-CM | POA: Diagnosis not present

## 2021-12-22 DIAGNOSIS — Z881 Allergy status to other antibiotic agents status: Secondary | ICD-10-CM | POA: Diagnosis not present

## 2021-12-22 DIAGNOSIS — Z87440 Personal history of urinary (tract) infections: Secondary | ICD-10-CM | POA: Diagnosis not present

## 2021-12-22 DIAGNOSIS — Z8616 Personal history of COVID-19: Secondary | ICD-10-CM | POA: Diagnosis not present

## 2021-12-22 DIAGNOSIS — I509 Heart failure, unspecified: Secondary | ICD-10-CM | POA: Diagnosis not present

## 2021-12-22 DIAGNOSIS — K8689 Other specified diseases of pancreas: Secondary | ICD-10-CM | POA: Diagnosis not present

## 2021-12-22 DIAGNOSIS — K529 Noninfective gastroenteritis and colitis, unspecified: Secondary | ICD-10-CM | POA: Diagnosis not present

## 2021-12-22 DIAGNOSIS — K56609 Unspecified intestinal obstruction, unspecified as to partial versus complete obstruction: Secondary | ICD-10-CM | POA: Diagnosis not present

## 2021-12-22 DIAGNOSIS — Z905 Acquired absence of kidney: Secondary | ICD-10-CM | POA: Diagnosis not present

## 2021-12-23 DIAGNOSIS — K565 Intestinal adhesions [bands], unspecified as to partial versus complete obstruction: Secondary | ICD-10-CM | POA: Diagnosis not present

## 2021-12-23 DIAGNOSIS — T8619 Other complication of kidney transplant: Secondary | ICD-10-CM | POA: Diagnosis not present

## 2021-12-23 DIAGNOSIS — C641 Malignant neoplasm of right kidney, except renal pelvis: Secondary | ICD-10-CM | POA: Diagnosis not present

## 2021-12-29 DIAGNOSIS — K56609 Unspecified intestinal obstruction, unspecified as to partial versus complete obstruction: Secondary | ICD-10-CM | POA: Diagnosis not present

## 2021-12-29 DIAGNOSIS — U071 COVID-19: Secondary | ICD-10-CM | POA: Diagnosis not present

## 2021-12-29 DIAGNOSIS — D849 Immunodeficiency, unspecified: Secondary | ICD-10-CM | POA: Diagnosis not present

## 2021-12-29 DIAGNOSIS — K529 Noninfective gastroenteritis and colitis, unspecified: Secondary | ICD-10-CM | POA: Diagnosis not present

## 2021-12-31 DIAGNOSIS — Z20822 Contact with and (suspected) exposure to covid-19: Secondary | ICD-10-CM | POA: Diagnosis not present

## 2022-01-03 DIAGNOSIS — D84821 Immunodeficiency due to drugs: Secondary | ICD-10-CM | POA: Diagnosis not present

## 2022-01-03 DIAGNOSIS — Z940 Kidney transplant status: Secondary | ICD-10-CM | POA: Diagnosis not present

## 2022-01-03 DIAGNOSIS — Z905 Acquired absence of kidney: Secondary | ICD-10-CM | POA: Diagnosis not present

## 2022-01-03 DIAGNOSIS — C642 Malignant neoplasm of left kidney, except renal pelvis: Secondary | ICD-10-CM | POA: Diagnosis not present

## 2022-01-03 DIAGNOSIS — T8619 Other complication of kidney transplant: Secondary | ICD-10-CM | POA: Diagnosis not present

## 2022-01-03 DIAGNOSIS — Z85528 Personal history of other malignant neoplasm of kidney: Secondary | ICD-10-CM | POA: Diagnosis not present

## 2022-01-03 DIAGNOSIS — Z4822 Encounter for aftercare following kidney transplant: Secondary | ICD-10-CM | POA: Diagnosis not present

## 2022-01-03 DIAGNOSIS — Z87891 Personal history of nicotine dependence: Secondary | ICD-10-CM | POA: Diagnosis not present

## 2022-01-04 DIAGNOSIS — M1A9XX1 Chronic gout, unspecified, with tophus (tophi): Secondary | ICD-10-CM | POA: Diagnosis not present

## 2022-01-11 DIAGNOSIS — M10341 Gout due to renal impairment, right hand: Secondary | ICD-10-CM | POA: Diagnosis not present

## 2022-01-11 DIAGNOSIS — E1022 Type 1 diabetes mellitus with diabetic chronic kidney disease: Secondary | ICD-10-CM | POA: Diagnosis not present

## 2022-01-11 DIAGNOSIS — Z9483 Pancreas transplant status: Secondary | ICD-10-CM | POA: Diagnosis not present

## 2022-01-11 DIAGNOSIS — I10 Essential (primary) hypertension: Secondary | ICD-10-CM | POA: Diagnosis not present

## 2022-01-11 DIAGNOSIS — N1832 Chronic kidney disease, stage 3b: Secondary | ICD-10-CM | POA: Diagnosis not present

## 2022-01-11 DIAGNOSIS — Z940 Kidney transplant status: Secondary | ICD-10-CM | POA: Diagnosis not present

## 2022-02-23 DIAGNOSIS — M109 Gout, unspecified: Secondary | ICD-10-CM | POA: Diagnosis not present

## 2022-02-23 DIAGNOSIS — I5032 Chronic diastolic (congestive) heart failure: Secondary | ICD-10-CM | POA: Diagnosis not present

## 2022-02-23 DIAGNOSIS — E785 Hyperlipidemia, unspecified: Secondary | ICD-10-CM | POA: Diagnosis not present

## 2022-02-23 DIAGNOSIS — I129 Hypertensive chronic kidney disease with stage 1 through stage 4 chronic kidney disease, or unspecified chronic kidney disease: Secondary | ICD-10-CM | POA: Diagnosis not present

## 2022-02-23 DIAGNOSIS — N189 Chronic kidney disease, unspecified: Secondary | ICD-10-CM | POA: Diagnosis not present

## 2022-02-23 DIAGNOSIS — E10319 Type 1 diabetes mellitus with unspecified diabetic retinopathy without macular edema: Secondary | ICD-10-CM | POA: Diagnosis not present

## 2022-03-05 ENCOUNTER — Ambulatory Visit (INDEPENDENT_AMBULATORY_CARE_PROVIDER_SITE_OTHER): Payer: Medicare Other

## 2022-03-05 ENCOUNTER — Other Ambulatory Visit: Payer: Self-pay

## 2022-03-05 VITALS — BP 134/60 | HR 89 | Ht 66.0 in | Wt 160.4 lb

## 2022-03-05 DIAGNOSIS — I2510 Atherosclerotic heart disease of native coronary artery without angina pectoris: Secondary | ICD-10-CM | POA: Diagnosis not present

## 2022-03-05 DIAGNOSIS — G4736 Sleep related hypoventilation in conditions classified elsewhere: Secondary | ICD-10-CM | POA: Diagnosis not present

## 2022-03-05 DIAGNOSIS — J9611 Chronic respiratory failure with hypoxia: Secondary | ICD-10-CM | POA: Diagnosis not present

## 2022-03-05 DIAGNOSIS — J84112 Idiopathic pulmonary fibrosis: Secondary | ICD-10-CM | POA: Diagnosis not present

## 2022-03-06 NOTE — Progress Notes (Signed)
? ?  Nurse Visit  ?  ?Date of Encounter: 03/06/2022 ?ID: Lucas Dunn, DOB 03/30/1946, MRN [id number] ? ?PCP:  Nicoletta Dress, MD ?  ?Cardiologist:  Jyl Heinz ?Advanced Practice Provider:  No care team member to display ?Electrophysiologist:  None  ? ? ? ?Visit Details  ? ?VS:  BP 134/60 (BP Location: Right Arm, Patient Position: Sitting)   Pulse 89   Ht '5\' 6"'$  (1.676 m)   Wt 160 lb 6.4 oz (72.8 kg)   SpO2 96%   BMI 25.89 kg/m?  , BMI Body mass index is 25.89 kg/m?. ? ?Wt Readings from Last 3 Encounters:  ?03/05/22 160 lb 6.4 oz (72.8 kg)  ?11/29/21 155 lb 12.8 oz (70.7 kg)  ?05/08/21 159 lb 12.8 oz (72.5 kg)  ?  ? ?Reason for visit: Patient had an irregular heartbeat at Dr. Maxie Barb office and was scheduled with [place] to have EKG performed to evaluate it. ?Performed today: Vitals, EKG, Provider consulted and Education ?Changes (medications, testing, etc.) : None ?Length of Visit: 20 minutes ? ?Medications Adjustments/Labs and Tests Ordered: ?Orders Placed This Encounter  ?Procedures  ? EKG 12-Lead  ? ?No orders of the defined types were placed in this encounter. ? ? ?Signed, ?Louie Casa, RN  ?03/06/2022 1:41 PM ? ? ?  ?

## 2022-03-16 DIAGNOSIS — Z20822 Contact with and (suspected) exposure to covid-19: Secondary | ICD-10-CM | POA: Diagnosis not present

## 2022-03-21 DIAGNOSIS — E103591 Type 1 diabetes mellitus with proliferative diabetic retinopathy without macular edema, right eye: Secondary | ICD-10-CM | POA: Diagnosis not present

## 2022-04-20 DIAGNOSIS — Z20822 Contact with and (suspected) exposure to covid-19: Secondary | ICD-10-CM | POA: Diagnosis not present

## 2022-04-24 DIAGNOSIS — J069 Acute upper respiratory infection, unspecified: Secondary | ICD-10-CM | POA: Diagnosis not present

## 2022-04-25 DIAGNOSIS — Z20822 Contact with and (suspected) exposure to covid-19: Secondary | ICD-10-CM | POA: Diagnosis not present

## 2022-04-26 DIAGNOSIS — Z20822 Contact with and (suspected) exposure to covid-19: Secondary | ICD-10-CM | POA: Diagnosis not present

## 2022-05-14 DIAGNOSIS — J84112 Idiopathic pulmonary fibrosis: Secondary | ICD-10-CM | POA: Diagnosis not present

## 2022-05-14 DIAGNOSIS — J479 Bronchiectasis, uncomplicated: Secondary | ICD-10-CM | POA: Diagnosis not present

## 2022-05-14 DIAGNOSIS — Z85528 Personal history of other malignant neoplasm of kidney: Secondary | ICD-10-CM | POA: Diagnosis not present

## 2022-05-14 DIAGNOSIS — I2510 Atherosclerotic heart disease of native coronary artery without angina pectoris: Secondary | ICD-10-CM | POA: Diagnosis not present

## 2022-05-25 ENCOUNTER — Other Ambulatory Visit: Payer: Self-pay

## 2022-05-25 DIAGNOSIS — N189 Chronic kidney disease, unspecified: Secondary | ICD-10-CM | POA: Diagnosis not present

## 2022-05-25 DIAGNOSIS — E785 Hyperlipidemia, unspecified: Secondary | ICD-10-CM | POA: Diagnosis not present

## 2022-05-25 DIAGNOSIS — Z6826 Body mass index (BMI) 26.0-26.9, adult: Secondary | ICD-10-CM | POA: Diagnosis not present

## 2022-05-25 DIAGNOSIS — Z79899 Other long term (current) drug therapy: Secondary | ICD-10-CM | POA: Diagnosis not present

## 2022-05-25 DIAGNOSIS — I129 Hypertensive chronic kidney disease with stage 1 through stage 4 chronic kidney disease, or unspecified chronic kidney disease: Secondary | ICD-10-CM | POA: Diagnosis not present

## 2022-05-25 DIAGNOSIS — E10319 Type 1 diabetes mellitus with unspecified diabetic retinopathy without macular edema: Secondary | ICD-10-CM | POA: Diagnosis not present

## 2022-05-25 DIAGNOSIS — I5032 Chronic diastolic (congestive) heart failure: Secondary | ICD-10-CM | POA: Diagnosis not present

## 2022-05-25 DIAGNOSIS — Z125 Encounter for screening for malignant neoplasm of prostate: Secondary | ICD-10-CM | POA: Diagnosis not present

## 2022-05-25 DIAGNOSIS — M109 Gout, unspecified: Secondary | ICD-10-CM | POA: Diagnosis not present

## 2022-05-28 ENCOUNTER — Ambulatory Visit (INDEPENDENT_AMBULATORY_CARE_PROVIDER_SITE_OTHER): Payer: Medicare Other | Admitting: Cardiology

## 2022-05-28 ENCOUNTER — Encounter: Payer: Self-pay | Admitting: Cardiology

## 2022-05-28 VITALS — BP 181/68 | HR 84 | Ht 66.0 in | Wt 163.4 lb

## 2022-05-28 DIAGNOSIS — I2510 Atherosclerotic heart disease of native coronary artery without angina pectoris: Secondary | ICD-10-CM | POA: Diagnosis not present

## 2022-05-28 DIAGNOSIS — E782 Mixed hyperlipidemia: Secondary | ICD-10-CM

## 2022-05-28 DIAGNOSIS — I700 Atherosclerosis of aorta: Secondary | ICD-10-CM | POA: Diagnosis not present

## 2022-05-28 DIAGNOSIS — I10 Essential (primary) hypertension: Secondary | ICD-10-CM | POA: Diagnosis not present

## 2022-05-28 DIAGNOSIS — E088 Diabetes mellitus due to underlying condition with unspecified complications: Secondary | ICD-10-CM

## 2022-05-28 HISTORY — DX: Diabetes mellitus due to underlying condition with unspecified complications: E08.8

## 2022-05-28 NOTE — Progress Notes (Signed)
Cardiology Office Note:    Date:  05/28/2022   ID:  [name], DOB May 23, 1946, MRN [id number]  PCP:  Nicoletta Dress, MD  Cardiologist:  Jenean Lindau, MD   Referring MD: Nicoletta Dress, MD    ASSESSMENT:    1. Essential hypertension   2. Coronary artery disease involving native coronary artery of native heart without angina pectoris   3. Aortic atherosclerosis ([place])   4. Mixed dyslipidemia   5. Diabetes mellitus due to underlying condition with unspecified complications ([place])    PLAN:    In order of problems listed above:  Coronary artery disease: Secondary prevention stressed with the patient.  Importance of compliance with diet medication stressed any vocalized understanding.  He was advised to walk at least half an day 5 days a week and he promises to do so. Essential hypertension: Blood pressure is elevated but he has an element of whitecoat hypertension.  He and his wife showed blood pressure readings at home and they are excellent.  I reassured the patient. Mixed dyslipidemia and diabetes mellitus: The recent blood work done by primary care on Friday.  He was told that are fine.  I am awaiting a copy of Renal insufficiency: Managed by primary care.  Patient is postrenal transplant. Patient will be seen in follow-up appointment in 6 months or earlier if the patient has any concerns    Medication Adjustments/Labs and Tests Ordered: Current medicines are reviewed at length with the patient today.  Concerns regarding medicines are outlined above.  No orders of the defined types were placed in this encounter.  No orders of the defined types were placed in this encounter.    No chief complaint on file.    History of Present Illness:    Kennis Wissmann is a 76 y.o. male.  Patient has past medical history of coronary artery disease, essential hypertension, aortic atherosclerosis and is post kidney transplant.  He has history of diabetes mellitus.  He denies any  problems at this time and takes care of activities of daily living.  No chest pain orthopnea or PND.  At the time of my evaluation, the patient is alert awake oriented and in no distress.  Past Medical History:  Diagnosis Date   Acute respiratory failure with hypoxia ([place]) 02/13/2020   Amblyopia of left eye 10/10/2012   Aortic atherosclerosis ([place]) 04/21/2020   Blindness, legal 02/13/2020   Bradycardia 04/21/2020   CAD (coronary artery disease) 10/10/2012   Cancer of kidney, left ([place]) 05/08/2021   Formatting of this note might be different from the original. 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 Partial nephrectomy 06/19/2021 for RCC   CHF (congestive heart failure), NYHA class II, chronic, diastolic ([place]) 63/0/1601   Chronic pain of right hip 10/15/2014   Coagulopathy ([place]) 02/13/2020   COVID-19 virus detected 02/05/2020   Diabetic macular edema of both eyes ([place]) 10/10/2012   Essential hypertension 10/10/2012   Immunosuppression ([place]) 05/05/2018   Left renal mass 02/11/2020   Liver mass, right lobe 02/11/2020   Mitral valve annular calcification 11/24/2020   Mixed dyslipidemia 08/24/2020   Nocturnal hypoxemia 06/06/2021   Formatting of this note might be different from the original. Uses 2L via [place]   Nuclear sclerotic cataract of left eye 10/10/2012   Osteopenia 10/10/2012   Formatting of this note might be different from the original. A. Last  BMD 09/2004.   PDR (proliferative diabetic retinopathy) ([place]) 10/10/2012   Formatting of this note might be different from the original. S/P  PPV  OD   Pneumonia due to COVID-19 virus 02/11/2020   Pseudophakia of right eye 10/10/2012   Pulmonary embolism associated with COVID-19 ([place]) 02/11/2020   Renal mass of kidney transplant 02/11/2020   Right carotid bruit 10/10/2012   S/P kidney transplant 09/08/2000   Formatting of this note might be  different from the original. A. 1B, 1DR match.       B. Kidney cold ischemia time 18 hours.       C. 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     D. Zenapax induction; 09/13/00 discharge creatinine 3.2.   E. 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   F. Negative renal transplant artery arteriogram 6/04.   Stage 3a chronic kidney disease ([place]) 02/11/2020   Type 1 diabetes mellitus ([place]) 10/14/2012   Formatting of this note might be different from the original. A. 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 B. Retinopathy C. Mild neuropathy.    Past Surgical History:  Procedure Laterality Date   COMBINED KIDNEY-PANCREAS TRANSPLANT     KIDNEY TRANSPLANT      Current Medications: Current Meds  Medication Sig   allopurinol (ZYLOPRIM) 100 MG tablet Take 150 mg by mouth daily.   amLODipine (NORVASC) 5 MG tablet Take 5 mg by mouth daily.   atorvastatin (LIPITOR) 20 MG tablet Take 20 mg by mouth at bedtime.   Calcium Carbonate-Vitamin D 600-200 MG-UNIT TABS Take 1 tablet by mouth 2 (two) times daily with a meal.   furosemide (LASIX) 20 MG tablet Take 40 mg by mouth every morning. And takes 20 mg in the evening   glimepiride (AMARYL) 1 MG tablet Take 1 mg by mouth every morning.   mycophenolate (CELLCEPT) 250 MG capsule Take 500 mg by mouth 2 (two) times daily.   predniSONE (DELTASONE) 5 MG tablet Take 5 mg by mouth daily.   ROCKLATAN 0.02-0.005 % SOLN Place 1 drop into the right eye at bedtime.   tacrolimus (PROGRAF) 0.5 MG capsule Take 2 capsules by mouth every morning. And take 1 tablet in the evening     Allergies:   Latex, Levofloxacin, and Other   Social History   Socioeconomic History   Marital status: Married    Spouse name: Not on file   Number of children: Not on file   Years of education: Not on file   Highest education level: Not on file  Occupational History   Not on file  Tobacco Use   Smoking status: Former   Smokeless tobacco: Never  Substance and Sexual Activity   Alcohol use: Not on file   Drug use: Not on file    Sexual activity: Not on file  Other Topics Concern   Not on file  Social History Narrative   Not on file   Social Determinants of Health   Financial Resource Strain: Not on file  Food Insecurity: Not on file  Transportation Needs: Not on file  Physical Activity: Not on file  Stress: Not on file  Social Connections: Not on file     Family History: The patient's family history includes Diabetes in his brother; Heart disease in his father; Hypertension in his brother, father, and mother.  ROS:   Please see the history of present illness.    All other systems reviewed and are negative.  EKGs/Labs/Other Studies Reviewed:    The following studies were reviewed today: EKG reveals sinus rhythm  and nonspecific ST-T changes   Recent Labs: No results found for requested labs within last 8760 hours.  Recent Lipid Panel No results found for: CHOL, TRIG, HDL, CHOLHDL, VLDL, LDLCALC, LDLDIRECT  Physical Exam:    VS:  BP (!) 181/68   Pulse 84   Ht '5\' 6"'$  (1.676 m)   Wt 163 lb 6.4 oz (74.1 kg)   SpO2 94%   BMI 26.37 kg/m     Wt Readings from Last 3 Encounters:  05/28/22 163 lb 6.4 oz (74.1 kg)  03/05/22 160 lb 6.4 oz (72.8 kg)  11/29/21 155 lb 12.8 oz (70.7 kg)     GEN: Patient is in no acute distress HEENT: Normal NECK: No JVD; No carotid bruits LYMPHATICS: No lymphadenopathy CARDIAC: Hear sounds regular, 2/6 systolic murmur at the apex. RESPIRATORY:  Clear to auscultation without rales, wheezing or rhonchi  ABDOMEN: Soft, non-tender, non-distended MUSCULOSKELETAL:  No edema; No deformity  SKIN: Warm and dry NEUROLOGIC:  Alert and oriented x 3 PSYCHIATRIC:  Normal affect   Signed, Jenean Lindau, MD  05/28/2022 1:49 PM    [facility]

## 2022-05-28 NOTE — Patient Instructions (Signed)

## 2022-06-05 DIAGNOSIS — J9611 Chronic respiratory failure with hypoxia: Secondary | ICD-10-CM | POA: Diagnosis not present

## 2022-06-05 DIAGNOSIS — J84112 Idiopathic pulmonary fibrosis: Secondary | ICD-10-CM | POA: Diagnosis not present

## 2022-06-05 DIAGNOSIS — G4736 Sleep related hypoventilation in conditions classified elsewhere: Secondary | ICD-10-CM | POA: Diagnosis not present

## 2022-07-04 DIAGNOSIS — Z08 Encounter for follow-up examination after completed treatment for malignant neoplasm: Secondary | ICD-10-CM | POA: Diagnosis not present

## 2022-07-04 DIAGNOSIS — Z87891 Personal history of nicotine dependence: Secondary | ICD-10-CM | POA: Diagnosis not present

## 2022-07-04 DIAGNOSIS — Z79899 Other long term (current) drug therapy: Secondary | ICD-10-CM | POA: Diagnosis not present

## 2022-07-04 DIAGNOSIS — Z86711 Personal history of pulmonary embolism: Secondary | ICD-10-CM | POA: Diagnosis not present

## 2022-07-04 DIAGNOSIS — Z905 Acquired absence of kidney: Secondary | ICD-10-CM | POA: Diagnosis not present

## 2022-07-04 DIAGNOSIS — R918 Other nonspecific abnormal finding of lung field: Secondary | ICD-10-CM | POA: Diagnosis not present

## 2022-07-04 DIAGNOSIS — R9389 Abnormal findings on diagnostic imaging of other specified body structures: Secondary | ICD-10-CM | POA: Diagnosis not present

## 2022-07-04 DIAGNOSIS — Z940 Kidney transplant status: Secondary | ICD-10-CM | POA: Diagnosis not present

## 2022-07-04 DIAGNOSIS — K862 Cyst of pancreas: Secondary | ICD-10-CM | POA: Diagnosis not present

## 2022-07-04 DIAGNOSIS — Z85528 Personal history of other malignant neoplasm of kidney: Secondary | ICD-10-CM | POA: Diagnosis not present

## 2022-07-16 DIAGNOSIS — Z940 Kidney transplant status: Secondary | ICD-10-CM | POA: Diagnosis not present

## 2022-07-16 DIAGNOSIS — Z85528 Personal history of other malignant neoplasm of kidney: Secondary | ICD-10-CM | POA: Diagnosis not present

## 2022-07-16 DIAGNOSIS — N1832 Chronic kidney disease, stage 3b: Secondary | ICD-10-CM | POA: Diagnosis not present

## 2022-07-16 DIAGNOSIS — D849 Immunodeficiency, unspecified: Secondary | ICD-10-CM | POA: Diagnosis not present

## 2022-07-16 DIAGNOSIS — I10 Essential (primary) hypertension: Secondary | ICD-10-CM | POA: Diagnosis not present

## 2022-07-19 ENCOUNTER — Other Ambulatory Visit: Payer: Self-pay

## 2022-07-19 NOTE — Patient Outreach (Signed)
  Care Coordination   Outreach  Visit Note   07/19/2022 Name: Lucas Dunn MRN: [id number] DOB: [birth year]  Lucas Dunn is a 76 y.o. year old male who sees Nicoletta Dress, MD for primary care. I spoke with  Lucas Dunn by phone today  What matters to the patients health and wellness today?  Outreach to patient to explain care coordination program and offer services.  Patient reports to me that his health is well managed and he has a visiting nurse.  Denies any need for nurse, LCSW or pharmacy at this time. Encouraged patient to inform MD if he changes his mind.   SDOH assessments and interventions completed:   No   Care Coordination Interventions Activated:  No Care Coordination Interventions:  No, not indicated  Follow up plan:  patient refused  Encounter Outcome:  Pt. Refused  Tomasa Rand, RN, BSN, CEN [facility], [place] [employer] [phone]

## 2022-08-02 DIAGNOSIS — N189 Chronic kidney disease, unspecified: Secondary | ICD-10-CM | POA: Diagnosis not present

## 2022-08-16 DIAGNOSIS — N189 Chronic kidney disease, unspecified: Secondary | ICD-10-CM | POA: Diagnosis not present

## 2022-08-16 DIAGNOSIS — M549 Dorsalgia, unspecified: Secondary | ICD-10-CM | POA: Diagnosis not present

## 2022-08-16 DIAGNOSIS — R109 Unspecified abdominal pain: Secondary | ICD-10-CM | POA: Diagnosis not present

## 2022-08-16 DIAGNOSIS — M545 Low back pain, unspecified: Secondary | ICD-10-CM | POA: Diagnosis not present

## 2022-08-30 DIAGNOSIS — I5032 Chronic diastolic (congestive) heart failure: Secondary | ICD-10-CM | POA: Diagnosis not present

## 2022-08-30 DIAGNOSIS — N189 Chronic kidney disease, unspecified: Secondary | ICD-10-CM | POA: Diagnosis not present

## 2022-08-30 DIAGNOSIS — Z79899 Other long term (current) drug therapy: Secondary | ICD-10-CM | POA: Diagnosis not present

## 2022-08-30 DIAGNOSIS — E785 Hyperlipidemia, unspecified: Secondary | ICD-10-CM | POA: Diagnosis not present

## 2022-08-30 DIAGNOSIS — M109 Gout, unspecified: Secondary | ICD-10-CM | POA: Diagnosis not present

## 2022-08-30 DIAGNOSIS — I129 Hypertensive chronic kidney disease with stage 1 through stage 4 chronic kidney disease, or unspecified chronic kidney disease: Secondary | ICD-10-CM | POA: Diagnosis not present

## 2022-08-30 DIAGNOSIS — J9611 Chronic respiratory failure with hypoxia: Secondary | ICD-10-CM | POA: Diagnosis not present

## 2022-08-30 DIAGNOSIS — G4733 Obstructive sleep apnea (adult) (pediatric): Secondary | ICD-10-CM | POA: Diagnosis not present

## 2022-08-30 DIAGNOSIS — J84112 Idiopathic pulmonary fibrosis: Secondary | ICD-10-CM | POA: Diagnosis not present

## 2022-08-30 DIAGNOSIS — Z6825 Body mass index (BMI) 25.0-25.9, adult: Secondary | ICD-10-CM | POA: Diagnosis not present

## 2022-08-30 DIAGNOSIS — E10319 Type 1 diabetes mellitus with unspecified diabetic retinopathy without macular edema: Secondary | ICD-10-CM | POA: Diagnosis not present

## 2022-09-04 DIAGNOSIS — J9611 Chronic respiratory failure with hypoxia: Secondary | ICD-10-CM | POA: Diagnosis not present

## 2022-09-04 DIAGNOSIS — J84112 Idiopathic pulmonary fibrosis: Secondary | ICD-10-CM | POA: Diagnosis not present

## 2022-09-04 DIAGNOSIS — G4736 Sleep related hypoventilation in conditions classified elsewhere: Secondary | ICD-10-CM | POA: Diagnosis not present

## 2022-09-11 DIAGNOSIS — J84112 Idiopathic pulmonary fibrosis: Secondary | ICD-10-CM | POA: Diagnosis not present

## 2022-09-11 DIAGNOSIS — G4733 Obstructive sleep apnea (adult) (pediatric): Secondary | ICD-10-CM | POA: Diagnosis not present

## 2022-09-11 DIAGNOSIS — J9611 Chronic respiratory failure with hypoxia: Secondary | ICD-10-CM | POA: Diagnosis not present

## 2022-09-11 DIAGNOSIS — R0600 Dyspnea, unspecified: Secondary | ICD-10-CM | POA: Diagnosis not present

## 2022-09-20 DIAGNOSIS — Z139 Encounter for screening, unspecified: Secondary | ICD-10-CM | POA: Diagnosis not present

## 2022-09-20 DIAGNOSIS — Z0000 Encounter for general adult medical examination without abnormal findings: Secondary | ICD-10-CM | POA: Diagnosis not present

## 2022-09-20 DIAGNOSIS — Z9181 History of falling: Secondary | ICD-10-CM | POA: Diagnosis not present

## 2022-09-20 DIAGNOSIS — Z1331 Encounter for screening for depression: Secondary | ICD-10-CM | POA: Diagnosis not present

## 2022-09-20 DIAGNOSIS — E785 Hyperlipidemia, unspecified: Secondary | ICD-10-CM | POA: Diagnosis not present

## 2022-09-27 ENCOUNTER — Ambulatory Visit: Payer: Medicare Other | Attending: Cardiology | Admitting: Cardiology

## 2022-09-27 ENCOUNTER — Encounter: Payer: Self-pay | Admitting: Cardiology

## 2022-09-27 VITALS — BP 143/75 | HR 72 | Ht 66.0 in | Wt 157.8 lb

## 2022-09-27 DIAGNOSIS — I700 Atherosclerosis of aorta: Secondary | ICD-10-CM | POA: Insufficient documentation

## 2022-09-27 DIAGNOSIS — I5189 Other ill-defined heart diseases: Secondary | ICD-10-CM | POA: Diagnosis not present

## 2022-09-27 DIAGNOSIS — E088 Diabetes mellitus due to underlying condition with unspecified complications: Secondary | ICD-10-CM | POA: Insufficient documentation

## 2022-09-27 DIAGNOSIS — E782 Mixed hyperlipidemia: Secondary | ICD-10-CM | POA: Insufficient documentation

## 2022-09-27 DIAGNOSIS — N1831 Chronic kidney disease, stage 3a: Secondary | ICD-10-CM | POA: Insufficient documentation

## 2022-09-27 DIAGNOSIS — Z940 Kidney transplant status: Secondary | ICD-10-CM | POA: Diagnosis not present

## 2022-09-27 DIAGNOSIS — I2510 Atherosclerotic heart disease of native coronary artery without angina pectoris: Secondary | ICD-10-CM | POA: Insufficient documentation

## 2022-09-27 DIAGNOSIS — I10 Essential (primary) hypertension: Secondary | ICD-10-CM | POA: Insufficient documentation

## 2022-09-27 HISTORY — DX: Other ill-defined heart diseases: I51.89

## 2022-09-27 NOTE — Patient Instructions (Addendum)
Medication Instructions:  Your physician recommends that you continue on your current medications as directed. Please refer to the Current Medication list given to you today.  *If you need a refill on your cardiac medications before your next appointment, please call your pharmacy*   Lab Work: Your physician recommends that you have a BMET and CBC today.  If you have labs (blood work) drawn today and your tests are completely normal, you will receive your results only by: [place] (if you have MyChart) OR A paper copy in the mail If you have any lab test that is abnormal or we need to change your treatment, we will call you to review the results.   Testing/Procedures: None ordered   Follow-Up: At [facility], you and your health needs are our priority.  As part of our continuing mission to provide you with exceptional heart care, we have created designated Provider Care Teams.  These Care Teams include your primary Cardiologist (physician) and Advanced Practice Providers (APPs -  Physician Assistants and Nurse Practitioners) who all work together to provide you with the care you need, when you need it.  We recommend signing up for the patient portal called "MyChart".  Sign up information is provided on this After Visit Summary.  MyChart is used to connect with patients for Virtual Visits (Telemedicine).  Patients are able to view lab/test results, encounter notes, upcoming appointments, etc.  Non-urgent messages can be sent to your provider as well.   To learn more about what you can do with MyChart, go to [email or web address].    Your next appointment:   9 month(s)  The format for your next appointment:   In Person  Provider:   Jyl Heinz, MD   Other Instructions

## 2022-09-27 NOTE — Progress Notes (Signed)
Cardiology Office Note:    Date:  09/27/2022   ID:  [name], DOB 04-26-1946, MRN [id number]  PCP:  Nicoletta Dress, MD  Cardiologist:  Jenean Lindau, MD   Referring MD: Nicoletta Dress, MD    ASSESSMENT:    1. Coronary artery disease involving native coronary artery of native heart without angina pectoris   2. Essential hypertension   3. Aortic atherosclerosis ([place])   4. S/P kidney transplant   5. Stage 3a chronic kidney disease ([place])   6. Mixed dyslipidemia   7. Diabetes mellitus due to underlying condition with unspecified complications ([place])    PLAN:    In order of problems listed above:  Coronary artery disease: Secondary prevention stressed with the patient.  Importance of compliance with diet and medication stressed and he vocalized understanding. Essential hypertension: Blood pressure stable and diet was emphasized.  Patient follows nephrologist closely for his renal issues.  His posttransplant Cardiac/cardiac valvular mass: I reviewed report with the patient and they are concerned about it so I will set him up for a cardiac MRI to further evaluate the pathology in question. Mixed dyslipidemia: On lipid-lowering medications.  Lipids reviewed and discussed with patient. Post renal transplant: Significant renal insufficiency and followed by nephrologist. Patient will be seen in follow-up appointment in 6 months or earlier if the patient has any concerns    Medication Adjustments/Labs and Tests Ordered: Current medicines are reviewed at length with the patient today.  Concerns regarding medicines are outlined above.  No orders of the defined types were placed in this encounter.  No orders of the defined types were placed in this encounter.    No chief complaint on file.    History of Present Illness:    Lucas Dunn is a 76 y.o. male.  Patient has past medical history of coronary artery disease, aortic atherosclerosis, essential hypertension, mixed  dyslipidemia and diabetes mellitus.  He is supposedly transplant and has renal insufficiency.  He denies any chest pain orthopnea or PND.  An echodensity/mass was noted in the area of the mitral valve from an echocardiogram done at the pulmonary doctors office and therefore they are little worried and here for evaluation.  At the time of my evaluation, the patient is alert awake oriented and in no distress.  Past Medical History:  Diagnosis Date   Acute respiratory failure with hypoxia ([place]) 02/13/2020   Amblyopia of left eye 10/10/2012   Aortic atherosclerosis ([place]) 04/21/2020   Blindness, legal 02/13/2020   Bradycardia 04/21/2020   CAD (coronary artery disease) 10/10/2012   Cancer of kidney, left ([place]) 05/08/2021   Formatting of this note might be different from the original. 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 Uses 2L  via [place]   Nuclear sclerotic cataract of left eye 10/10/2012   Osteopenia 10/10/2012   Formatting of this note might be different from the original. A. Last BMD 09/2004.   PDR (proliferative diabetic retinopathy) ([place]) 10/10/2012   Formatting of this note might be different from the  original. S/P  PPV  OD   Pneumonia due to COVID-19 virus 02/11/2020   Pseudophakia of right eye 10/10/2012   Pulmonary embolism associated with COVID-19 ([place]) 02/11/2020   Renal mass of kidney transplant 02/11/2020   Right carotid bruit 10/10/2012   S/P kidney transplant 09/08/2000   Formatting of this note might be different from the original. A. 1B, 1DR match.       B. Kidney cold ischemia time 18 hours.       C. 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     D. Zenapax induction; 09/13/00 discharge creatinine 3.2.   E. 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   F. Negative renal transplant artery arteriogram 6/04.   Stage 3a chronic kidney disease ([place]) 02/11/2020   Type 1 diabetes mellitus ([place]) 10/14/2012   Formatting of this note might be different from the original. A. ESRD 10/99. B. Retinopathy C. Mild neuropathy.    Past Surgical History:  Procedure Laterality Date   COMBINED KIDNEY-PANCREAS TRANSPLANT     KIDNEY TRANSPLANT      Current Medications: Current Meds  Medication Sig   allopurinol (ZYLOPRIM) 100 MG tablet Take 150 mg by mouth daily.   amLODipine (NORVASC) 5 MG tablet Take 5 mg by mouth daily.   atorvastatin (LIPITOR) 20 MG tablet Take 20 mg by mouth at bedtime.   Calcium Carbonate-Vitamin D 600-200 MG-UNIT TABS Take 1 tablet by mouth 2 (two) times daily with a meal.   furosemide (LASIX) 20 MG tablet Take 40 mg by mouth every morning. And takes 20 mg in the evening   glimepiride (AMARYL) 1 MG tablet Take 1 mg by mouth every morning.   mycophenolate (CELLCEPT) 250 MG capsule Take 500 mg by mouth 2 (two) times daily.   predniSONE (DELTASONE) 5 MG tablet Take 5 mg by mouth daily.   ROCKLATAN 0.02-0.005 % SOLN Place 1 drop into the right eye at bedtime.   tacrolimus (PROGRAF) 0.5 MG capsule Take 2 capsules by mouth every morning. And take 1 tablet in the evening     Allergies:   Latex, Levofloxacin, and Other   Social History   Socioeconomic History   Marital status: Married    Spouse  name: Not on file   Number of children: Not on file   Years of education: Not on file   Highest education level: Not on file  Occupational History   Not on file  Tobacco Use   Smoking status: Former   Smokeless tobacco: Never  Substance and Sexual Activity   Alcohol use: Not on file   Drug use: Not on file   Sexual activity: Not on file  Other Topics Concern   Not on file  Social History Narrative   Not on file   Social Determinants of Health   Financial Resource Strain: Not on file  Food Insecurity: Not on file  Transportation Needs: Not on file  Physical Activity: Not on file  Stress: Not on file  Social Connections: Not on file     Family History: The patient's family history includes Diabetes in his brother; Heart disease in his father; Hypertension in his brother, father, and mother.  ROS:   Please see the history of  present illness.    All other systems reviewed and are negative.  EKGs/Labs/Other Studies Reviewed:    The following studies were reviewed today: I discussed echo report with the patient at length and the scanned report is in the chart.   Recent Labs: No results found for requested labs within last 365 days.  Recent Lipid Panel No results found for: "CHOL", "TRIG", "HDL", "CHOLHDL", "VLDL", "LDLCALC", "LDLDIRECT"  Physical Exam:    VS:  BP (!) 143/75   Pulse 72   Ht '5\' 6"'$  (1.676 m)   Wt 157 lb 12.8 oz (71.6 kg)   SpO2 94%   BMI 25.47 kg/m     Wt Readings from Last 3 Encounters:  09/27/22 157 lb 12.8 oz (71.6 kg)  05/28/22 163 lb 6.4 oz (74.1 kg)  03/05/22 160 lb 6.4 oz (72.8 kg)     GEN: Patient is in no acute distress HEENT: Normal NECK: No JVD; No carotid bruits LYMPHATICS: No lymphadenopathy CARDIAC: Hear sounds regular, 2/6 systolic murmur at the apex. RESPIRATORY:  Clear to auscultation without rales, wheezing or rhonchi  ABDOMEN: Soft, non-tender, non-distended MUSCULOSKELETAL:  No edema; No deformity  SKIN: Warm and  dry NEUROLOGIC:  Alert and oriented x 3 PSYCHIATRIC:  Normal affect   Signed, Jenean Lindau, MD  09/27/2022 10:16 AM    [place]

## 2022-09-28 LAB — BASIC METABOLIC PANEL
BUN/Creatinine Ratio: 23 (ref 10–24)
BUN: 46 mg/dL — ABNORMAL HIGH (ref 8–27)
CO2: 25 mmol/L (ref 20–29)
Calcium: 9.5 mg/dL (ref 8.6–10.2)
Chloride: 101 mmol/L (ref 96–106)
Creatinine, Ser: 1.96 mg/dL — ABNORMAL HIGH (ref 0.76–1.27)
Glucose: 141 mg/dL — ABNORMAL HIGH (ref 70–99)
Potassium: 3.9 mmol/L (ref 3.5–5.2)
Sodium: 140 mmol/L (ref 134–144)
eGFR: 35 mL/min/{1.73_m2} — ABNORMAL LOW (ref >59)

## 2022-09-28 LAB — CBC
Hematocrit: 42.3 % (ref 37.5–51.0)
Hemoglobin: 14 g/dL (ref 13.0–17.7)
MCH: 28.3 pg (ref 26.6–33.0)
MCHC: 33.1 g/dL (ref 31.5–35.7)
MCV: 86 fL (ref 79–97)
Platelets: 206 10*3/uL (ref 150–450)
RBC: 4.94 x10E6/uL (ref 4.14–5.80)
RDW: 15.4 % (ref 11.6–15.4)
WBC: 13.2 10*3/uL — ABNORMAL HIGH (ref 3.4–10.8)

## 2022-10-16 DIAGNOSIS — Z23 Encounter for immunization: Secondary | ICD-10-CM | POA: Diagnosis not present

## 2022-10-29 DIAGNOSIS — H5213 Myopia, bilateral: Secondary | ICD-10-CM | POA: Diagnosis not present

## 2022-10-29 DIAGNOSIS — E103591 Type 1 diabetes mellitus with proliferative diabetic retinopathy without macular edema, right eye: Secondary | ICD-10-CM | POA: Diagnosis not present

## 2022-12-03 DIAGNOSIS — Z6826 Body mass index (BMI) 26.0-26.9, adult: Secondary | ICD-10-CM | POA: Diagnosis not present

## 2022-12-03 DIAGNOSIS — E10319 Type 1 diabetes mellitus with unspecified diabetic retinopathy without macular edema: Secondary | ICD-10-CM | POA: Diagnosis not present

## 2022-12-03 DIAGNOSIS — Z79899 Other long term (current) drug therapy: Secondary | ICD-10-CM | POA: Diagnosis not present

## 2022-12-03 DIAGNOSIS — M109 Gout, unspecified: Secondary | ICD-10-CM | POA: Diagnosis not present

## 2022-12-03 DIAGNOSIS — I5032 Chronic diastolic (congestive) heart failure: Secondary | ICD-10-CM | POA: Diagnosis not present

## 2022-12-03 DIAGNOSIS — N189 Chronic kidney disease, unspecified: Secondary | ICD-10-CM | POA: Diagnosis not present

## 2022-12-03 DIAGNOSIS — E785 Hyperlipidemia, unspecified: Secondary | ICD-10-CM | POA: Diagnosis not present

## 2022-12-04 DIAGNOSIS — J479 Bronchiectasis, uncomplicated: Secondary | ICD-10-CM | POA: Diagnosis not present

## 2022-12-04 DIAGNOSIS — J439 Emphysema, unspecified: Secondary | ICD-10-CM | POA: Diagnosis not present

## 2022-12-04 DIAGNOSIS — I517 Cardiomegaly: Secondary | ICD-10-CM | POA: Diagnosis not present

## 2022-12-04 DIAGNOSIS — I700 Atherosclerosis of aorta: Secondary | ICD-10-CM | POA: Diagnosis not present

## 2022-12-04 DIAGNOSIS — I2510 Atherosclerotic heart disease of native coronary artery without angina pectoris: Secondary | ICD-10-CM | POA: Diagnosis not present

## 2022-12-04 DIAGNOSIS — J432 Centrilobular emphysema: Secondary | ICD-10-CM | POA: Diagnosis not present

## 2022-12-04 DIAGNOSIS — J84112 Idiopathic pulmonary fibrosis: Secondary | ICD-10-CM | POA: Diagnosis not present

## 2022-12-21 DIAGNOSIS — J479 Bronchiectasis, uncomplicated: Secondary | ICD-10-CM | POA: Diagnosis not present

## 2022-12-21 DIAGNOSIS — J84112 Idiopathic pulmonary fibrosis: Secondary | ICD-10-CM | POA: Diagnosis not present

## 2022-12-21 DIAGNOSIS — J9611 Chronic respiratory failure with hypoxia: Secondary | ICD-10-CM | POA: Diagnosis not present

## 2022-12-21 DIAGNOSIS — G4736 Sleep related hypoventilation in conditions classified elsewhere: Secondary | ICD-10-CM | POA: Diagnosis not present

## 2022-12-27 ENCOUNTER — Telehealth (HOSPITAL_COMMUNITY): Payer: Self-pay | Admitting: *Deleted

## 2022-12-27 NOTE — Telephone Encounter (Signed)
Reaching out to patient to offer assistance regarding upcoming cardiac imaging study; pt's wife answered phone verbalizes understanding of appt date/time, parking situation and where to check in, and verified current allergies; name and call back number provided for further questions should they arise  Gordy Clement RN [facility] [phone] office [phone] cell  Patient's wife states that the patient has had MRIs in the past without incident.  Only the right arm can be used to for IV.

## 2022-12-28 ENCOUNTER — Other Ambulatory Visit: Payer: Self-pay | Admitting: Cardiology

## 2022-12-28 ENCOUNTER — Ambulatory Visit (HOSPITAL_COMMUNITY)
Admission: RE | Admit: 2022-12-28 | Discharge: 2022-12-28 | Disposition: A | Discharge status: Home / Self Care | Payer: Medicare Other | Source: Ambulatory Visit | Attending: Cardiology | Admitting: Cardiology

## 2022-12-28 DIAGNOSIS — I5189 Other ill-defined heart diseases: Secondary | ICD-10-CM | POA: Diagnosis not present

## 2022-12-28 MED ORDER — GADOBUTROL 1 MMOL/ML IV SOLN
10.0000 mL | Freq: Once | INTRAVENOUS | Status: AC | PRN
  Administered 2022-12-28: 10 mL via INTRAVENOUS

## 2023-01-04 DIAGNOSIS — R0902 Hypoxemia: Secondary | ICD-10-CM | POA: Diagnosis not present

## 2023-01-04 DIAGNOSIS — E1122 Type 2 diabetes mellitus with diabetic chronic kidney disease: Secondary | ICD-10-CM | POA: Diagnosis not present

## 2023-01-04 DIAGNOSIS — Z87440 Personal history of urinary (tract) infections: Secondary | ICD-10-CM | POA: Diagnosis not present

## 2023-01-04 DIAGNOSIS — R000 Tachycardia, unspecified: Secondary | ICD-10-CM | POA: Diagnosis not present

## 2023-01-04 DIAGNOSIS — Z86711 Personal history of pulmonary embolism: Secondary | ICD-10-CM | POA: Diagnosis not present

## 2023-01-04 DIAGNOSIS — I132 Hypertensive heart and chronic kidney disease with heart failure and with stage 5 chronic kidney disease, or end stage renal disease: Secondary | ICD-10-CM | POA: Diagnosis not present

## 2023-01-04 DIAGNOSIS — J9601 Acute respiratory failure with hypoxia: Secondary | ICD-10-CM | POA: Diagnosis not present

## 2023-01-04 DIAGNOSIS — R5383 Other fatigue: Secondary | ICD-10-CM | POA: Diagnosis not present

## 2023-01-04 DIAGNOSIS — A419 Sepsis, unspecified organism: Secondary | ICD-10-CM | POA: Diagnosis not present

## 2023-01-04 DIAGNOSIS — D509 Iron deficiency anemia, unspecified: Secondary | ICD-10-CM | POA: Diagnosis not present

## 2023-01-04 DIAGNOSIS — M1990 Unspecified osteoarthritis, unspecified site: Secondary | ICD-10-CM | POA: Diagnosis not present

## 2023-01-04 DIAGNOSIS — Z940 Kidney transplant status: Secondary | ICD-10-CM | POA: Diagnosis not present

## 2023-01-04 DIAGNOSIS — E7800 Pure hypercholesterolemia, unspecified: Secondary | ICD-10-CM | POA: Diagnosis not present

## 2023-01-04 DIAGNOSIS — R6520 Severe sepsis without septic shock: Secondary | ICD-10-CM | POA: Diagnosis not present

## 2023-01-04 DIAGNOSIS — J189 Pneumonia, unspecified organism: Secondary | ICD-10-CM | POA: Diagnosis not present

## 2023-01-04 DIAGNOSIS — H5462 Unqualified visual loss, left eye, normal vision right eye: Secondary | ICD-10-CM | POA: Diagnosis not present

## 2023-01-04 DIAGNOSIS — D84821 Immunodeficiency due to drugs: Secondary | ICD-10-CM | POA: Diagnosis not present

## 2023-01-04 DIAGNOSIS — Z7984 Long term (current) use of oral hypoglycemic drugs: Secondary | ICD-10-CM | POA: Diagnosis not present

## 2023-01-04 DIAGNOSIS — Z881 Allergy status to other antibiotic agents status: Secondary | ICD-10-CM | POA: Diagnosis not present

## 2023-01-04 DIAGNOSIS — N185 Chronic kidney disease, stage 5: Secondary | ICD-10-CM | POA: Diagnosis not present

## 2023-01-04 DIAGNOSIS — I5032 Chronic diastolic (congestive) heart failure: Secondary | ICD-10-CM | POA: Diagnosis not present

## 2023-01-04 DIAGNOSIS — Z8701 Personal history of pneumonia (recurrent): Secondary | ICD-10-CM | POA: Diagnosis not present

## 2023-01-04 DIAGNOSIS — J1569 Pneumonia due to other gram-negative bacteria: Secondary | ICD-10-CM | POA: Diagnosis not present

## 2023-01-04 DIAGNOSIS — Z905 Acquired absence of kidney: Secondary | ICD-10-CM | POA: Diagnosis not present

## 2023-01-04 DIAGNOSIS — Z85528 Personal history of other malignant neoplasm of kidney: Secondary | ICD-10-CM | POA: Diagnosis not present

## 2023-01-04 DIAGNOSIS — Z9483 Pancreas transplant status: Secondary | ICD-10-CM | POA: Diagnosis not present

## 2023-01-04 DIAGNOSIS — Z87891 Personal history of nicotine dependence: Secondary | ICD-10-CM | POA: Diagnosis not present

## 2023-01-04 DIAGNOSIS — R0602 Shortness of breath: Secondary | ICD-10-CM | POA: Diagnosis not present

## 2023-01-04 DIAGNOSIS — J439 Emphysema, unspecified: Secondary | ICD-10-CM | POA: Diagnosis not present

## 2023-01-04 DIAGNOSIS — I2510 Atherosclerotic heart disease of native coronary artery without angina pectoris: Secondary | ICD-10-CM | POA: Diagnosis not present

## 2023-01-04 DIAGNOSIS — Z91040 Latex allergy status: Secondary | ICD-10-CM | POA: Diagnosis not present

## 2023-01-16 DIAGNOSIS — I129 Hypertensive chronic kidney disease with stage 1 through stage 4 chronic kidney disease, or unspecified chronic kidney disease: Secondary | ICD-10-CM | POA: Diagnosis not present

## 2023-01-16 DIAGNOSIS — E10319 Type 1 diabetes mellitus with unspecified diabetic retinopathy without macular edema: Secondary | ICD-10-CM | POA: Diagnosis not present

## 2023-01-16 DIAGNOSIS — N189 Chronic kidney disease, unspecified: Secondary | ICD-10-CM | POA: Diagnosis not present

## 2023-01-16 DIAGNOSIS — J9601 Acute respiratory failure with hypoxia: Secondary | ICD-10-CM | POA: Diagnosis not present

## 2023-01-16 DIAGNOSIS — A419 Sepsis, unspecified organism: Secondary | ICD-10-CM | POA: Diagnosis not present

## 2023-01-17 DIAGNOSIS — E11319 Type 2 diabetes mellitus with unspecified diabetic retinopathy without macular edema: Secondary | ICD-10-CM | POA: Diagnosis not present

## 2023-01-17 DIAGNOSIS — E7800 Pure hypercholesterolemia, unspecified: Secondary | ICD-10-CM | POA: Diagnosis not present

## 2023-01-17 DIAGNOSIS — M47816 Spondylosis without myelopathy or radiculopathy, lumbar region: Secondary | ICD-10-CM | POA: Diagnosis not present

## 2023-01-17 DIAGNOSIS — K219 Gastro-esophageal reflux disease without esophagitis: Secondary | ICD-10-CM | POA: Diagnosis not present

## 2023-01-17 DIAGNOSIS — D849 Immunodeficiency, unspecified: Secondary | ICD-10-CM | POA: Diagnosis not present

## 2023-01-17 DIAGNOSIS — J9621 Acute and chronic respiratory failure with hypoxia: Secondary | ICD-10-CM | POA: Diagnosis not present

## 2023-01-17 DIAGNOSIS — G4734 Idiopathic sleep related nonobstructive alveolar hypoventilation: Secondary | ICD-10-CM | POA: Diagnosis not present

## 2023-01-17 DIAGNOSIS — Z940 Kidney transplant status: Secondary | ICD-10-CM | POA: Diagnosis not present

## 2023-01-17 DIAGNOSIS — M150 Primary generalized (osteo)arthritis: Secondary | ICD-10-CM | POA: Diagnosis not present

## 2023-01-17 DIAGNOSIS — D631 Anemia in chronic kidney disease: Secondary | ICD-10-CM | POA: Diagnosis not present

## 2023-01-17 DIAGNOSIS — D509 Iron deficiency anemia, unspecified: Secondary | ICD-10-CM | POA: Diagnosis not present

## 2023-01-17 DIAGNOSIS — I132 Hypertensive heart and chronic kidney disease with heart failure and with stage 5 chronic kidney disease, or end stage renal disease: Secondary | ICD-10-CM | POA: Diagnosis not present

## 2023-01-17 DIAGNOSIS — Z9981 Dependence on supplemental oxygen: Secondary | ICD-10-CM | POA: Diagnosis not present

## 2023-01-17 DIAGNOSIS — E1122 Type 2 diabetes mellitus with diabetic chronic kidney disease: Secondary | ICD-10-CM | POA: Diagnosis not present

## 2023-01-17 DIAGNOSIS — N185 Chronic kidney disease, stage 5: Secondary | ICD-10-CM | POA: Diagnosis not present

## 2023-01-17 DIAGNOSIS — Z7952 Long term (current) use of systemic steroids: Secondary | ICD-10-CM | POA: Diagnosis not present

## 2023-01-17 DIAGNOSIS — Z86711 Personal history of pulmonary embolism: Secondary | ICD-10-CM | POA: Diagnosis not present

## 2023-01-17 DIAGNOSIS — H409 Unspecified glaucoma: Secondary | ICD-10-CM | POA: Diagnosis not present

## 2023-01-17 DIAGNOSIS — G8929 Other chronic pain: Secondary | ICD-10-CM | POA: Diagnosis not present

## 2023-01-17 DIAGNOSIS — I2510 Atherosclerotic heart disease of native coronary artery without angina pectoris: Secondary | ICD-10-CM | POA: Diagnosis not present

## 2023-01-17 DIAGNOSIS — I493 Ventricular premature depolarization: Secondary | ICD-10-CM | POA: Diagnosis not present

## 2023-01-17 DIAGNOSIS — M810 Age-related osteoporosis without current pathological fracture: Secondary | ICD-10-CM | POA: Diagnosis not present

## 2023-01-17 DIAGNOSIS — D72829 Elevated white blood cell count, unspecified: Secondary | ICD-10-CM | POA: Diagnosis not present

## 2023-01-17 DIAGNOSIS — M109 Gout, unspecified: Secondary | ICD-10-CM | POA: Diagnosis not present

## 2023-01-17 DIAGNOSIS — I5032 Chronic diastolic (congestive) heart failure: Secondary | ICD-10-CM | POA: Diagnosis not present

## 2023-01-18 DIAGNOSIS — D631 Anemia in chronic kidney disease: Secondary | ICD-10-CM | POA: Diagnosis not present

## 2023-01-18 DIAGNOSIS — N185 Chronic kidney disease, stage 5: Secondary | ICD-10-CM | POA: Diagnosis not present

## 2023-01-18 DIAGNOSIS — I5032 Chronic diastolic (congestive) heart failure: Secondary | ICD-10-CM | POA: Diagnosis not present

## 2023-01-18 DIAGNOSIS — E1122 Type 2 diabetes mellitus with diabetic chronic kidney disease: Secondary | ICD-10-CM | POA: Diagnosis not present

## 2023-01-18 DIAGNOSIS — I132 Hypertensive heart and chronic kidney disease with heart failure and with stage 5 chronic kidney disease, or end stage renal disease: Secondary | ICD-10-CM | POA: Diagnosis not present

## 2023-01-18 DIAGNOSIS — J9621 Acute and chronic respiratory failure with hypoxia: Secondary | ICD-10-CM | POA: Diagnosis not present

## 2023-01-21 DIAGNOSIS — Z4822 Encounter for aftercare following kidney transplant: Secondary | ICD-10-CM | POA: Diagnosis not present

## 2023-01-21 DIAGNOSIS — N183 Chronic kidney disease, stage 3 unspecified: Secondary | ICD-10-CM | POA: Diagnosis not present

## 2023-01-21 DIAGNOSIS — E1022 Type 1 diabetes mellitus with diabetic chronic kidney disease: Secondary | ICD-10-CM | POA: Diagnosis not present

## 2023-01-21 DIAGNOSIS — R059 Cough, unspecified: Secondary | ICD-10-CM | POA: Diagnosis not present

## 2023-01-21 DIAGNOSIS — Z9483 Pancreas transplant status: Secondary | ICD-10-CM | POA: Diagnosis not present

## 2023-01-21 DIAGNOSIS — J8410 Pulmonary fibrosis, unspecified: Secondary | ICD-10-CM | POA: Diagnosis not present

## 2023-01-21 DIAGNOSIS — I129 Hypertensive chronic kidney disease with stage 1 through stage 4 chronic kidney disease, or unspecified chronic kidney disease: Secondary | ICD-10-CM | POA: Diagnosis not present

## 2023-01-21 DIAGNOSIS — Z940 Kidney transplant status: Secondary | ICD-10-CM | POA: Diagnosis not present

## 2023-01-22 DIAGNOSIS — I132 Hypertensive heart and chronic kidney disease with heart failure and with stage 5 chronic kidney disease, or end stage renal disease: Secondary | ICD-10-CM | POA: Diagnosis not present

## 2023-01-22 DIAGNOSIS — N185 Chronic kidney disease, stage 5: Secondary | ICD-10-CM | POA: Diagnosis not present

## 2023-01-22 DIAGNOSIS — J9621 Acute and chronic respiratory failure with hypoxia: Secondary | ICD-10-CM | POA: Diagnosis not present

## 2023-01-22 DIAGNOSIS — I5032 Chronic diastolic (congestive) heart failure: Secondary | ICD-10-CM | POA: Diagnosis not present

## 2023-01-22 DIAGNOSIS — E1122 Type 2 diabetes mellitus with diabetic chronic kidney disease: Secondary | ICD-10-CM | POA: Diagnosis not present

## 2023-01-22 DIAGNOSIS — D631 Anemia in chronic kidney disease: Secondary | ICD-10-CM | POA: Diagnosis not present

## 2023-01-23 DIAGNOSIS — D631 Anemia in chronic kidney disease: Secondary | ICD-10-CM | POA: Diagnosis not present

## 2023-01-23 DIAGNOSIS — J9621 Acute and chronic respiratory failure with hypoxia: Secondary | ICD-10-CM | POA: Diagnosis not present

## 2023-01-23 DIAGNOSIS — E1122 Type 2 diabetes mellitus with diabetic chronic kidney disease: Secondary | ICD-10-CM | POA: Diagnosis not present

## 2023-01-23 DIAGNOSIS — N185 Chronic kidney disease, stage 5: Secondary | ICD-10-CM | POA: Diagnosis not present

## 2023-01-23 DIAGNOSIS — I5032 Chronic diastolic (congestive) heart failure: Secondary | ICD-10-CM | POA: Diagnosis not present

## 2023-01-23 DIAGNOSIS — I132 Hypertensive heart and chronic kidney disease with heart failure and with stage 5 chronic kidney disease, or end stage renal disease: Secondary | ICD-10-CM | POA: Diagnosis not present

## 2023-01-24 DIAGNOSIS — I132 Hypertensive heart and chronic kidney disease with heart failure and with stage 5 chronic kidney disease, or end stage renal disease: Secondary | ICD-10-CM | POA: Diagnosis not present

## 2023-01-24 DIAGNOSIS — D631 Anemia in chronic kidney disease: Secondary | ICD-10-CM | POA: Diagnosis not present

## 2023-01-24 DIAGNOSIS — N185 Chronic kidney disease, stage 5: Secondary | ICD-10-CM | POA: Diagnosis not present

## 2023-01-24 DIAGNOSIS — I5032 Chronic diastolic (congestive) heart failure: Secondary | ICD-10-CM | POA: Diagnosis not present

## 2023-01-24 DIAGNOSIS — E1122 Type 2 diabetes mellitus with diabetic chronic kidney disease: Secondary | ICD-10-CM | POA: Diagnosis not present

## 2023-01-24 DIAGNOSIS — J9621 Acute and chronic respiratory failure with hypoxia: Secondary | ICD-10-CM | POA: Diagnosis not present

## 2023-01-25 DIAGNOSIS — J9621 Acute and chronic respiratory failure with hypoxia: Secondary | ICD-10-CM | POA: Diagnosis not present

## 2023-01-25 DIAGNOSIS — J9601 Acute respiratory failure with hypoxia: Secondary | ICD-10-CM | POA: Diagnosis not present

## 2023-01-25 DIAGNOSIS — J189 Pneumonia, unspecified organism: Secondary | ICD-10-CM | POA: Diagnosis not present

## 2023-01-25 DIAGNOSIS — D849 Immunodeficiency, unspecified: Secondary | ICD-10-CM | POA: Diagnosis not present

## 2023-01-25 DIAGNOSIS — E1122 Type 2 diabetes mellitus with diabetic chronic kidney disease: Secondary | ICD-10-CM | POA: Diagnosis not present

## 2023-01-25 DIAGNOSIS — A419 Sepsis, unspecified organism: Secondary | ICD-10-CM | POA: Diagnosis not present

## 2023-01-25 DIAGNOSIS — I132 Hypertensive heart and chronic kidney disease with heart failure and with stage 5 chronic kidney disease, or end stage renal disease: Secondary | ICD-10-CM | POA: Diagnosis not present

## 2023-01-25 DIAGNOSIS — I493 Ventricular premature depolarization: Secondary | ICD-10-CM | POA: Diagnosis not present

## 2023-01-25 DIAGNOSIS — N185 Chronic kidney disease, stage 5: Secondary | ICD-10-CM | POA: Diagnosis not present

## 2023-01-25 DIAGNOSIS — D631 Anemia in chronic kidney disease: Secondary | ICD-10-CM | POA: Diagnosis not present

## 2023-01-25 DIAGNOSIS — I5032 Chronic diastolic (congestive) heart failure: Secondary | ICD-10-CM | POA: Diagnosis not present

## 2023-01-28 DIAGNOSIS — I132 Hypertensive heart and chronic kidney disease with heart failure and with stage 5 chronic kidney disease, or end stage renal disease: Secondary | ICD-10-CM | POA: Diagnosis not present

## 2023-01-28 DIAGNOSIS — J9621 Acute and chronic respiratory failure with hypoxia: Secondary | ICD-10-CM | POA: Diagnosis not present

## 2023-01-28 DIAGNOSIS — I5032 Chronic diastolic (congestive) heart failure: Secondary | ICD-10-CM | POA: Diagnosis not present

## 2023-01-28 DIAGNOSIS — D631 Anemia in chronic kidney disease: Secondary | ICD-10-CM | POA: Diagnosis not present

## 2023-01-28 DIAGNOSIS — E1122 Type 2 diabetes mellitus with diabetic chronic kidney disease: Secondary | ICD-10-CM | POA: Diagnosis not present

## 2023-01-28 DIAGNOSIS — N185 Chronic kidney disease, stage 5: Secondary | ICD-10-CM | POA: Diagnosis not present

## 2023-01-29 DIAGNOSIS — I132 Hypertensive heart and chronic kidney disease with heart failure and with stage 5 chronic kidney disease, or end stage renal disease: Secondary | ICD-10-CM | POA: Diagnosis not present

## 2023-01-29 DIAGNOSIS — D631 Anemia in chronic kidney disease: Secondary | ICD-10-CM | POA: Diagnosis not present

## 2023-01-29 DIAGNOSIS — J9621 Acute and chronic respiratory failure with hypoxia: Secondary | ICD-10-CM | POA: Diagnosis not present

## 2023-01-29 DIAGNOSIS — N185 Chronic kidney disease, stage 5: Secondary | ICD-10-CM | POA: Diagnosis not present

## 2023-01-29 DIAGNOSIS — I5032 Chronic diastolic (congestive) heart failure: Secondary | ICD-10-CM | POA: Diagnosis not present

## 2023-01-29 DIAGNOSIS — E1122 Type 2 diabetes mellitus with diabetic chronic kidney disease: Secondary | ICD-10-CM | POA: Diagnosis not present

## 2023-01-30 DIAGNOSIS — J90 Pleural effusion, not elsewhere classified: Secondary | ICD-10-CM | POA: Diagnosis not present

## 2023-01-30 DIAGNOSIS — R9389 Abnormal findings on diagnostic imaging of other specified body structures: Secondary | ICD-10-CM | POA: Diagnosis not present

## 2023-01-30 DIAGNOSIS — C802 Malignant neoplasm associated with transplanted organ: Secondary | ICD-10-CM | POA: Diagnosis not present

## 2023-01-30 DIAGNOSIS — J189 Pneumonia, unspecified organism: Secondary | ICD-10-CM | POA: Diagnosis not present

## 2023-01-30 DIAGNOSIS — Z85528 Personal history of other malignant neoplasm of kidney: Secondary | ICD-10-CM | POA: Diagnosis not present

## 2023-01-30 DIAGNOSIS — C649 Malignant neoplasm of unspecified kidney, except renal pelvis: Secondary | ICD-10-CM | POA: Diagnosis not present

## 2023-01-30 DIAGNOSIS — Z87891 Personal history of nicotine dependence: Secondary | ICD-10-CM | POA: Diagnosis not present

## 2023-01-30 DIAGNOSIS — T8619 Other complication of kidney transplant: Secondary | ICD-10-CM | POA: Diagnosis not present

## 2023-02-04 DIAGNOSIS — D631 Anemia in chronic kidney disease: Secondary | ICD-10-CM | POA: Diagnosis not present

## 2023-02-04 DIAGNOSIS — J9621 Acute and chronic respiratory failure with hypoxia: Secondary | ICD-10-CM | POA: Diagnosis not present

## 2023-02-04 DIAGNOSIS — N185 Chronic kidney disease, stage 5: Secondary | ICD-10-CM | POA: Diagnosis not present

## 2023-02-04 DIAGNOSIS — I132 Hypertensive heart and chronic kidney disease with heart failure and with stage 5 chronic kidney disease, or end stage renal disease: Secondary | ICD-10-CM | POA: Diagnosis not present

## 2023-02-04 DIAGNOSIS — E1122 Type 2 diabetes mellitus with diabetic chronic kidney disease: Secondary | ICD-10-CM | POA: Diagnosis not present

## 2023-02-04 DIAGNOSIS — I5032 Chronic diastolic (congestive) heart failure: Secondary | ICD-10-CM | POA: Diagnosis not present

## 2023-02-06 DIAGNOSIS — N185 Chronic kidney disease, stage 5: Secondary | ICD-10-CM | POA: Diagnosis not present

## 2023-02-06 DIAGNOSIS — E1122 Type 2 diabetes mellitus with diabetic chronic kidney disease: Secondary | ICD-10-CM | POA: Diagnosis not present

## 2023-02-06 DIAGNOSIS — D631 Anemia in chronic kidney disease: Secondary | ICD-10-CM | POA: Diagnosis not present

## 2023-02-06 DIAGNOSIS — I5032 Chronic diastolic (congestive) heart failure: Secondary | ICD-10-CM | POA: Diagnosis not present

## 2023-02-06 DIAGNOSIS — J9621 Acute and chronic respiratory failure with hypoxia: Secondary | ICD-10-CM | POA: Diagnosis not present

## 2023-02-06 DIAGNOSIS — I132 Hypertensive heart and chronic kidney disease with heart failure and with stage 5 chronic kidney disease, or end stage renal disease: Secondary | ICD-10-CM | POA: Diagnosis not present

## 2023-02-08 DIAGNOSIS — T8619 Other complication of kidney transplant: Secondary | ICD-10-CM | POA: Diagnosis not present

## 2023-02-08 DIAGNOSIS — Z9483 Pancreas transplant status: Secondary | ICD-10-CM | POA: Diagnosis not present

## 2023-02-08 DIAGNOSIS — J479 Bronchiectasis, uncomplicated: Secondary | ICD-10-CM | POA: Diagnosis not present

## 2023-02-08 DIAGNOSIS — R9389 Abnormal findings on diagnostic imaging of other specified body structures: Secondary | ICD-10-CM | POA: Diagnosis not present

## 2023-02-08 DIAGNOSIS — I2510 Atherosclerotic heart disease of native coronary artery without angina pectoris: Secondary | ICD-10-CM | POA: Diagnosis not present

## 2023-02-08 DIAGNOSIS — C802 Malignant neoplasm associated with transplanted organ: Secondary | ICD-10-CM | POA: Diagnosis not present

## 2023-02-08 DIAGNOSIS — K8689 Other specified diseases of pancreas: Secondary | ICD-10-CM | POA: Diagnosis not present

## 2023-02-08 DIAGNOSIS — Z85528 Personal history of other malignant neoplasm of kidney: Secondary | ICD-10-CM | POA: Diagnosis not present

## 2023-02-08 DIAGNOSIS — R918 Other nonspecific abnormal finding of lung field: Secondary | ICD-10-CM | POA: Diagnosis not present

## 2023-02-08 DIAGNOSIS — C649 Malignant neoplasm of unspecified kidney, except renal pelvis: Secondary | ICD-10-CM | POA: Diagnosis not present

## 2023-02-08 DIAGNOSIS — J90 Pleural effusion, not elsewhere classified: Secondary | ICD-10-CM | POA: Diagnosis not present

## 2023-02-08 DIAGNOSIS — N281 Cyst of kidney, acquired: Secondary | ICD-10-CM | POA: Diagnosis not present

## 2023-02-13 DIAGNOSIS — J9611 Chronic respiratory failure with hypoxia: Secondary | ICD-10-CM | POA: Diagnosis not present

## 2023-02-13 DIAGNOSIS — N189 Chronic kidney disease, unspecified: Secondary | ICD-10-CM | POA: Diagnosis not present

## 2023-02-13 DIAGNOSIS — I129 Hypertensive chronic kidney disease with stage 1 through stage 4 chronic kidney disease, or unspecified chronic kidney disease: Secondary | ICD-10-CM | POA: Diagnosis not present

## 2023-02-14 DIAGNOSIS — E1122 Type 2 diabetes mellitus with diabetic chronic kidney disease: Secondary | ICD-10-CM | POA: Diagnosis not present

## 2023-02-14 DIAGNOSIS — D631 Anemia in chronic kidney disease: Secondary | ICD-10-CM | POA: Diagnosis not present

## 2023-02-14 DIAGNOSIS — N185 Chronic kidney disease, stage 5: Secondary | ICD-10-CM | POA: Diagnosis not present

## 2023-02-14 DIAGNOSIS — J9621 Acute and chronic respiratory failure with hypoxia: Secondary | ICD-10-CM | POA: Diagnosis not present

## 2023-02-14 DIAGNOSIS — I132 Hypertensive heart and chronic kidney disease with heart failure and with stage 5 chronic kidney disease, or end stage renal disease: Secondary | ICD-10-CM | POA: Diagnosis not present

## 2023-02-14 DIAGNOSIS — I5032 Chronic diastolic (congestive) heart failure: Secondary | ICD-10-CM | POA: Diagnosis not present

## 2023-02-15 DIAGNOSIS — I132 Hypertensive heart and chronic kidney disease with heart failure and with stage 5 chronic kidney disease, or end stage renal disease: Secondary | ICD-10-CM | POA: Diagnosis not present

## 2023-02-15 DIAGNOSIS — D631 Anemia in chronic kidney disease: Secondary | ICD-10-CM | POA: Diagnosis not present

## 2023-02-15 DIAGNOSIS — I5032 Chronic diastolic (congestive) heart failure: Secondary | ICD-10-CM | POA: Diagnosis not present

## 2023-02-15 DIAGNOSIS — E1122 Type 2 diabetes mellitus with diabetic chronic kidney disease: Secondary | ICD-10-CM | POA: Diagnosis not present

## 2023-02-15 DIAGNOSIS — N185 Chronic kidney disease, stage 5: Secondary | ICD-10-CM | POA: Diagnosis not present

## 2023-02-15 DIAGNOSIS — J9621 Acute and chronic respiratory failure with hypoxia: Secondary | ICD-10-CM | POA: Diagnosis not present

## 2023-02-16 DIAGNOSIS — J189 Pneumonia, unspecified organism: Secondary | ICD-10-CM | POA: Diagnosis not present

## 2023-02-16 DIAGNOSIS — J9621 Acute and chronic respiratory failure with hypoxia: Secondary | ICD-10-CM | POA: Diagnosis not present

## 2023-02-16 DIAGNOSIS — A419 Sepsis, unspecified organism: Secondary | ICD-10-CM | POA: Diagnosis not present

## 2023-02-16 DIAGNOSIS — I5032 Chronic diastolic (congestive) heart failure: Secondary | ICD-10-CM | POA: Diagnosis not present

## 2023-02-16 DIAGNOSIS — I132 Hypertensive heart and chronic kidney disease with heart failure and with stage 5 chronic kidney disease, or end stage renal disease: Secondary | ICD-10-CM | POA: Diagnosis not present

## 2023-02-16 DIAGNOSIS — G4734 Idiopathic sleep related nonobstructive alveolar hypoventilation: Secondary | ICD-10-CM | POA: Diagnosis not present

## 2023-02-16 DIAGNOSIS — H409 Unspecified glaucoma: Secondary | ICD-10-CM | POA: Diagnosis not present

## 2023-02-16 DIAGNOSIS — N185 Chronic kidney disease, stage 5: Secondary | ICD-10-CM | POA: Diagnosis not present

## 2023-02-16 DIAGNOSIS — J849 Interstitial pulmonary disease, unspecified: Secondary | ICD-10-CM | POA: Diagnosis not present

## 2023-02-16 DIAGNOSIS — G8929 Other chronic pain: Secondary | ICD-10-CM | POA: Diagnosis not present

## 2023-02-16 DIAGNOSIS — M150 Primary generalized (osteo)arthritis: Secondary | ICD-10-CM | POA: Diagnosis not present

## 2023-02-16 DIAGNOSIS — K219 Gastro-esophageal reflux disease without esophagitis: Secondary | ICD-10-CM | POA: Diagnosis not present

## 2023-02-16 DIAGNOSIS — R6520 Severe sepsis without septic shock: Secondary | ICD-10-CM | POA: Diagnosis not present

## 2023-02-16 DIAGNOSIS — E7800 Pure hypercholesterolemia, unspecified: Secondary | ICD-10-CM | POA: Diagnosis not present

## 2023-02-16 DIAGNOSIS — D631 Anemia in chronic kidney disease: Secondary | ICD-10-CM | POA: Diagnosis not present

## 2023-02-16 DIAGNOSIS — E11319 Type 2 diabetes mellitus with unspecified diabetic retinopathy without macular edema: Secondary | ICD-10-CM | POA: Diagnosis not present

## 2023-02-16 DIAGNOSIS — M109 Gout, unspecified: Secondary | ICD-10-CM | POA: Diagnosis not present

## 2023-02-16 DIAGNOSIS — I493 Ventricular premature depolarization: Secondary | ICD-10-CM | POA: Diagnosis not present

## 2023-02-16 DIAGNOSIS — D72829 Elevated white blood cell count, unspecified: Secondary | ICD-10-CM | POA: Diagnosis not present

## 2023-02-16 DIAGNOSIS — M47816 Spondylosis without myelopathy or radiculopathy, lumbar region: Secondary | ICD-10-CM | POA: Diagnosis not present

## 2023-02-16 DIAGNOSIS — E1122 Type 2 diabetes mellitus with diabetic chronic kidney disease: Secondary | ICD-10-CM | POA: Diagnosis not present

## 2023-02-16 DIAGNOSIS — D509 Iron deficiency anemia, unspecified: Secondary | ICD-10-CM | POA: Diagnosis not present

## 2023-02-16 DIAGNOSIS — D849 Immunodeficiency, unspecified: Secondary | ICD-10-CM | POA: Diagnosis not present

## 2023-02-16 DIAGNOSIS — M810 Age-related osteoporosis without current pathological fracture: Secondary | ICD-10-CM | POA: Diagnosis not present

## 2023-02-16 DIAGNOSIS — I2510 Atherosclerotic heart disease of native coronary artery without angina pectoris: Secondary | ICD-10-CM | POA: Diagnosis not present

## 2023-02-18 DIAGNOSIS — I132 Hypertensive heart and chronic kidney disease with heart failure and with stage 5 chronic kidney disease, or end stage renal disease: Secondary | ICD-10-CM | POA: Diagnosis not present

## 2023-02-18 DIAGNOSIS — J189 Pneumonia, unspecified organism: Secondary | ICD-10-CM | POA: Diagnosis not present

## 2023-02-18 DIAGNOSIS — A419 Sepsis, unspecified organism: Secondary | ICD-10-CM | POA: Diagnosis not present

## 2023-02-18 DIAGNOSIS — J9621 Acute and chronic respiratory failure with hypoxia: Secondary | ICD-10-CM | POA: Diagnosis not present

## 2023-02-18 DIAGNOSIS — R6520 Severe sepsis without septic shock: Secondary | ICD-10-CM | POA: Diagnosis not present

## 2023-02-18 DIAGNOSIS — I5032 Chronic diastolic (congestive) heart failure: Secondary | ICD-10-CM | POA: Diagnosis not present

## 2023-02-20 DIAGNOSIS — J9611 Chronic respiratory failure with hypoxia: Secondary | ICD-10-CM | POA: Diagnosis not present

## 2023-02-20 DIAGNOSIS — J479 Bronchiectasis, uncomplicated: Secondary | ICD-10-CM | POA: Diagnosis not present

## 2023-02-20 DIAGNOSIS — R918 Other nonspecific abnormal finding of lung field: Secondary | ICD-10-CM | POA: Diagnosis not present

## 2023-02-20 DIAGNOSIS — J84112 Idiopathic pulmonary fibrosis: Secondary | ICD-10-CM | POA: Diagnosis not present

## 2023-02-20 DIAGNOSIS — G4736 Sleep related hypoventilation in conditions classified elsewhere: Secondary | ICD-10-CM | POA: Diagnosis not present

## 2023-02-22 ENCOUNTER — Telehealth: Payer: Self-pay

## 2023-02-22 NOTE — Patient Outreach (Signed)
  Care Coordination   Initial Visit Note   02/22/2023 Name: Lucas Dunn MRN: [id number] DOB: [birth year]  Lucas Dunn is a 77 y.o. year old male who sees Nicoletta Dress, MD for primary care. I spoke with  [name] by phone today.  What matters to the patients health and wellness today?  Placed call to patient today to review and offer [facility] care coordination program. Patient reports that he is doing well and denies any needs today. Encouraged patient to call me back or inform MD if he changes his mind.    SDOH assessments and interventions completed:  No     Care Coordination Interventions:  No, not indicated   Follow up plan: No further intervention required.   Encounter Outcome:  Pt. Refused   Tomasa Rand, RN, BSN, CEN [facility] [employer] [phone]

## 2023-02-25 ENCOUNTER — Ambulatory Visit: Payer: Medicare Other | Attending: Cardiology | Admitting: Cardiology

## 2023-02-25 ENCOUNTER — Encounter: Payer: Self-pay | Admitting: Cardiology

## 2023-02-25 VITALS — BP 147/79 | Ht 66.0 in | Wt 159.6 lb

## 2023-02-25 DIAGNOSIS — I10 Essential (primary) hypertension: Secondary | ICD-10-CM | POA: Diagnosis not present

## 2023-02-25 DIAGNOSIS — I2510 Atherosclerotic heart disease of native coronary artery without angina pectoris: Secondary | ICD-10-CM | POA: Diagnosis not present

## 2023-02-25 DIAGNOSIS — E088 Diabetes mellitus due to underlying condition with unspecified complications: Secondary | ICD-10-CM

## 2023-02-25 DIAGNOSIS — E782 Mixed hyperlipidemia: Secondary | ICD-10-CM | POA: Diagnosis not present

## 2023-02-25 NOTE — Progress Notes (Signed)
Cardiology Office Note:    Date:  02/25/2023   ID:  Lucas Dunn, DOB Oct 21, 1946, MRN [id number]  PCP:  Nicoletta Dress, MD  Cardiologist:  Jenean Lindau, MD   Referring MD: Nicoletta Dress, MD    ASSESSMENT:    1. Essential hypertension   2. Coronary artery disease involving native coronary artery of native heart without angina pectoris   3. Diabetes mellitus due to underlying condition with unspecified complications ([place])   4. Mixed dyslipidemia    PLAN:    In order of problems listed above:  Coronary artery disease: Secondary prevention stressed with the patient.  Importance of compliance with diet medication stressed and he vocalized understanding. Essential hypertension: Blood pressure is stable and diet was emphasized. 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  Followed by primary care.  Labs were reviewed. Diabetes mellitus and renal insufficiency post renal transplant: Stable and diet was emphasized. COPD on oxygen and I reviewed recent CT report done by pulmonologist.  I discussed with him at length. Patient will be seen in follow-up appointment in 6 months or earlier if the patient has any concerns    Medication Adjustments/Labs and Tests Ordered: Current medicines are reviewed at length with the patient today.  Concerns regarding medicines are outlined above.  No orders of the defined types were placed in this encounter.  No orders of the defined types were placed in this encounter.    No chief complaint on file.    History of Present Illness:    Lucas Dunn is a 77 y.o. male.  Patient has past medical history of coronary artery disease, essential hypertension, renal transplant and history of cancer of the transplanted kidney.  He is currently doing fine.  He has COPD and is on oxygen supplemental all the time.  No chest pain orthopnea or PND.  His wife accompanies him for this visit.  At the time of my evaluation, the patient is alert awake  oriented and in no distress.  EKG reveals  Past Medical History:  Diagnosis Date   Acute respiratory failure with hypoxia ([place]) 02/13/2020   Amblyopia of left eye 10/10/2012   Aortic atherosclerosis ([place]) 04/21/2020   Blindness, legal 02/13/2020   Bradycardia 04/21/2020   CAD (coronary artery disease) 10/10/2012   Cancer of kidney, left ([place]) 05/08/2021   Formatting of this note might be different from the original. 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   CHF (congestive heart failure), NYHA class II, chronic, diastolic ([place]) [id number]   Chronic pain of right hip 10/15/2014   Coagulopathy ([place]) 02/13/2020   COVID-19 virus detected 02/05/2020   Diabetes mellitus due to underlying condition with unspecified complications ([place]) 0000000   Diabetic macular edema of both eyes ([place]) 10/10/2012   Essential hypertension 10/10/2012   Immunosuppression ([place]) 05/05/2018   Left renal mass 02/11/2020   Liver mass, right lobe 02/11/2020   Mitral valve annular calcification 11/24/2020   Mixed dyslipidemia 08/24/2020   Nocturnal hypoxemia 06/06/2021   Formatting of this note might be different from the original. Uses 2L via [place]   Nuclear sclerotic cataract of left eye 10/10/2012   Osteopenia 10/10/2012   Formatting of this note might be different from the original. A. Last BMD 09/2004.   PDR (proliferative diabetic retinopathy) ([place]) 10/10/2012   Formatting of this  note might be different from the original. S/P  PPV  OD   Pneumonia due to COVID-19 virus 02/11/2020   Pseudophakia of right eye 10/10/2012   Pulmonary embolism associated with COVID-19 ([place]) 02/11/2020   Renal mass of kidney transplant 02/11/2020   Right carotid bruit 10/10/2012   S/P kidney transplant 09/08/2000   Formatting of this note might  be different from the original. A. 1B, 1DR match.       B. Kidney cold ischemia time 18 hours.       C. 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     D. Zenapax induction; 09/13/00 discharge creatinine 3.2.   E. 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   F. Negative renal transplant artery arteriogram 6/04.   Stage 3a chronic kidney disease ([place]) 02/11/2020   Type 1 diabetes mellitus ([place]) 10/14/2012   Formatting of this note might be different from the original. A. 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 B. Retinopathy C. Mild neuropathy.    Past Surgical History:  Procedure Laterality Date   COMBINED KIDNEY-PANCREAS TRANSPLANT     KIDNEY TRANSPLANT      Current Medications: Current Meds  Medication Sig   ALBUTEROL IN Inhale 1 vial into the lungs as needed (wheezing or shortness of breath).   allopurinol (ZYLOPRIM) 100 MG tablet Take 150 mg by mouth daily.   amLODipine (NORVASC) 5 MG tablet Take 5 mg by mouth daily.   atorvastatin (LIPITOR) 20 MG tablet Take 20 mg by mouth at bedtime.   Calcium Carbonate-Vitamin D 600-200 MG-UNIT TABS Take 1 tablet by mouth 2 (two) times daily with a meal.   furosemide (LASIX) 20 MG tablet Take 40 mg by mouth every morning. And takes 20 mg in the evening   glimepiride (AMARYL) 1 MG tablet Take 1 mg by mouth every morning.   mycophenolate (CELLCEPT) 250 MG capsule Take 500 mg by mouth 2 (two) times daily.   predniSONE (DELTASONE) 5 MG tablet Take 5 mg by mouth daily.   ROCKLATAN 0.02-0.005 % SOLN Place 1 drop into the right eye at bedtime.   tacrolimus (PROGRAF) 0.5 MG capsule Take 2 capsules by mouth every morning. And take 1 tablet in the evening     Allergies:   Latex, Levofloxacin, and Other   Social History   Socioeconomic History   Marital status: Married    Spouse name: Not on file   Number of children: Not on file   Years of education: Not on file   Highest education level: Not on file  Occupational History   Not on file  Tobacco Use   Smoking status: Former   Smokeless tobacco:  Never  Substance and Sexual Activity   Alcohol use: Not on file   Drug use: Not on file   Sexual activity: Not on file  Other Topics Concern   Not on file  Social History Narrative   Not on file   Social Determinants of Health   Financial Resource Strain: Not on file  Food Insecurity: Not on file  Transportation Needs: Not on file  Physical Activity: Not on file  Stress: Not on file  Social Connections: Not on file     Family History: The patient's family history includes Diabetes in his brother; Heart disease in his father; Hypertension in his brother, father, and mother.  ROS:   Please see the history of present illness.    All other systems reviewed and are negative.  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 A dedicated cardiac coil was used. Functional imaging was done using Fiesta sequences. 2,3, and 4 chamber views were done to assess for RWMA's. Modified Simpson's rule using a short axis stack was used to calculate an ejection fraction on a dedicated work Conservation officer, nature. The patient received 8 cc of Gadavist. After 10 minutes inversion recovery sequences were used to assess for infiltration and scar tissue.   CONTRAST:  8 cc Gadavist   FINDINGS: Limited images of the lung fields showed no gross abnormalities.   Mildly dilated left ventricle with mild LV hypertrophy, low normal to mildly decreased systolic function EF [id number], no regional wall motion abnormalities. Mildly dilated right ventricular size with normal systolic function, EF [id number]. Normal right and left atrial sizes. Trileaflet aortic valve with no significant regurgitation or stenosis. Mitral regurgitant fraction 37% suggests moderate MR. 1.5 mm  nodular mass at the posterior mitral valve annulus is likely nodular mitral annular calcification.   First pass perfusion did not show contrast uptake in the mitral annular mass.   On delayed enhancement imaging, there was no definite myocardial late gadolinium enhancement (LGE).   MEASUREMENTS: MEASUREMENTS LVEDV 193 mL   LVEDVi 105 mL/m2 LVSV 97 mL   LVEF 51%   RVEDV 215 mL   RVEDVi 118 mL/m2 RVSV 108 mL   RVEF 50%   Aortic forward volume 61 mL   T1 images are not accurate.   IMPRESSION: 1. Mildly dilated LV with mild LV hypertrophy. Low normal to mildlly decreased systolic function, [id number], without regional wall motion abnormalities.   2.  Mildly dilated RV with normal systolic function, EF [id number].   3. 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 There is nodular mitral annular calcification at the posterior mitral valve annulus.   4. No myocardial LGE so no evidence for prior MI, infiltrative disease, or myocarditis.   Lucas Dunn     Electronically Signed   By: Loralie Champagne M.D.   On: 12/31/2022 13:32     Recent Labs: 09/27/2022: BUN 46; Creatinine, Ser 1.96; Hemoglobin 14.0; Platelets 206; Potassium 3.9; Sodium 140  Recent Lipid Panel No results found for: "CHOL", "TRIG", "HDL", "CHOLHDL", "VLDL", "LDLCALC", "LDLDIRECT"  Physical Exam:    VS:  BP (!) 147/79   Ht '5\' 6"'$  (1.676 m)   Wt 159 lb 9.6 oz (72.4 kg)   SpO2 91%   BMI 25.76 kg/m     Wt Readings from Last 3 Encounters:  02/25/23 159 lb 9.6 oz (72.4 kg)  09/27/22 157 lb 12.8 oz (71.6 kg)  05/28/22 163 lb 6.4 oz (74.1 kg)     GEN: Patient is in no acute distress HEENT: Normal NECK: No JVD; No carotid bruits LYMPHATICS: No lymphadenopathy CARDIAC: Hear sounds regular, 2/6 systolic murmur at the apex. RESPIRATORY:  Clear to auscultation without rales, wheezing or rhonchi  ABDOMEN: Soft, non-tender, non-distended MUSCULOSKELETAL:  No edema; No deformity  SKIN: Warm and  dry NEUROLOGIC:  Alert and oriented x 3 PSYCHIATRIC:  Normal affect   Signed, Jenean Lindau, MD  02/25/2023 10:47 AM    [place]

## 2023-02-25 NOTE — Patient Instructions (Signed)

## 2023-02-26 DIAGNOSIS — I5032 Chronic diastolic (congestive) heart failure: Secondary | ICD-10-CM | POA: Diagnosis not present

## 2023-02-26 DIAGNOSIS — I132 Hypertensive heart and chronic kidney disease with heart failure and with stage 5 chronic kidney disease, or end stage renal disease: Secondary | ICD-10-CM | POA: Diagnosis not present

## 2023-02-26 DIAGNOSIS — A419 Sepsis, unspecified organism: Secondary | ICD-10-CM | POA: Diagnosis not present

## 2023-02-26 DIAGNOSIS — J9621 Acute and chronic respiratory failure with hypoxia: Secondary | ICD-10-CM | POA: Diagnosis not present

## 2023-02-26 DIAGNOSIS — J189 Pneumonia, unspecified organism: Secondary | ICD-10-CM | POA: Diagnosis not present

## 2023-02-26 DIAGNOSIS — R6520 Severe sepsis without septic shock: Secondary | ICD-10-CM | POA: Diagnosis not present

## 2023-03-04 DIAGNOSIS — I5032 Chronic diastolic (congestive) heart failure: Secondary | ICD-10-CM | POA: Diagnosis not present

## 2023-03-04 DIAGNOSIS — N189 Chronic kidney disease, unspecified: Secondary | ICD-10-CM | POA: Diagnosis not present

## 2023-03-04 DIAGNOSIS — Z79899 Other long term (current) drug therapy: Secondary | ICD-10-CM | POA: Diagnosis not present

## 2023-03-04 DIAGNOSIS — E10319 Type 1 diabetes mellitus with unspecified diabetic retinopathy without macular edema: Secondary | ICD-10-CM | POA: Diagnosis not present

## 2023-03-04 DIAGNOSIS — E785 Hyperlipidemia, unspecified: Secondary | ICD-10-CM | POA: Diagnosis not present

## 2023-03-04 DIAGNOSIS — M109 Gout, unspecified: Secondary | ICD-10-CM | POA: Diagnosis not present

## 2023-03-10 DIAGNOSIS — K5900 Constipation, unspecified: Secondary | ICD-10-CM | POA: Diagnosis not present

## 2023-03-10 DIAGNOSIS — D849 Immunodeficiency, unspecified: Secondary | ICD-10-CM | POA: Diagnosis not present

## 2023-03-10 DIAGNOSIS — J9621 Acute and chronic respiratory failure with hypoxia: Secondary | ICD-10-CM | POA: Diagnosis not present

## 2023-03-10 DIAGNOSIS — I10 Essential (primary) hypertension: Secondary | ICD-10-CM | POA: Diagnosis not present

## 2023-03-10 DIAGNOSIS — D631 Anemia in chronic kidney disease: Secondary | ICD-10-CM | POA: Diagnosis not present

## 2023-03-10 DIAGNOSIS — R000 Tachycardia, unspecified: Secondary | ICD-10-CM | POA: Diagnosis not present

## 2023-03-10 DIAGNOSIS — R109 Unspecified abdominal pain: Secondary | ICD-10-CM | POA: Diagnosis not present

## 2023-03-10 DIAGNOSIS — N179 Acute kidney failure, unspecified: Secondary | ICD-10-CM | POA: Diagnosis not present

## 2023-03-10 DIAGNOSIS — E1122 Type 2 diabetes mellitus with diabetic chronic kidney disease: Secondary | ICD-10-CM | POA: Diagnosis not present

## 2023-03-10 DIAGNOSIS — E119 Type 2 diabetes mellitus without complications: Secondary | ICD-10-CM | POA: Diagnosis not present

## 2023-03-10 DIAGNOSIS — J84112 Idiopathic pulmonary fibrosis: Secondary | ICD-10-CM | POA: Diagnosis not present

## 2023-03-10 DIAGNOSIS — E871 Hypo-osmolality and hyponatremia: Secondary | ICD-10-CM | POA: Diagnosis not present

## 2023-03-10 DIAGNOSIS — A419 Sepsis, unspecified organism: Secondary | ICD-10-CM | POA: Diagnosis not present

## 2023-03-10 DIAGNOSIS — M19049 Primary osteoarthritis, unspecified hand: Secondary | ICD-10-CM | POA: Diagnosis not present

## 2023-03-10 DIAGNOSIS — I2109 ST elevation (STEMI) myocardial infarction involving other coronary artery of anterior wall: Secondary | ICD-10-CM | POA: Diagnosis not present

## 2023-03-10 DIAGNOSIS — E7800 Pure hypercholesterolemia, unspecified: Secondary | ICD-10-CM | POA: Diagnosis not present

## 2023-03-10 DIAGNOSIS — I130 Hypertensive heart and chronic kidney disease with heart failure and stage 1 through stage 4 chronic kidney disease, or unspecified chronic kidney disease: Secondary | ICD-10-CM | POA: Diagnosis not present

## 2023-03-10 DIAGNOSIS — J189 Pneumonia, unspecified organism: Secondary | ICD-10-CM | POA: Diagnosis not present

## 2023-03-10 DIAGNOSIS — Z940 Kidney transplant status: Secondary | ICD-10-CM | POA: Diagnosis not present

## 2023-03-10 DIAGNOSIS — Z9483 Pancreas transplant status: Secondary | ICD-10-CM | POA: Diagnosis not present

## 2023-03-10 DIAGNOSIS — E875 Hyperkalemia: Secondary | ICD-10-CM | POA: Diagnosis not present

## 2023-03-10 DIAGNOSIS — I2510 Atherosclerotic heart disease of native coronary artery without angina pectoris: Secondary | ICD-10-CM | POA: Diagnosis not present

## 2023-03-10 DIAGNOSIS — I493 Ventricular premature depolarization: Secondary | ICD-10-CM | POA: Diagnosis not present

## 2023-03-10 DIAGNOSIS — M479 Spondylosis, unspecified: Secondary | ICD-10-CM | POA: Diagnosis not present

## 2023-03-10 DIAGNOSIS — I340 Nonrheumatic mitral (valve) insufficiency: Secondary | ICD-10-CM | POA: Diagnosis not present

## 2023-03-10 DIAGNOSIS — R0602 Shortness of breath: Secondary | ICD-10-CM | POA: Diagnosis not present

## 2023-03-10 DIAGNOSIS — M549 Dorsalgia, unspecified: Secondary | ICD-10-CM | POA: Diagnosis not present

## 2023-03-10 DIAGNOSIS — N1832 Chronic kidney disease, stage 3b: Secondary | ICD-10-CM | POA: Diagnosis not present

## 2023-03-10 DIAGNOSIS — I5032 Chronic diastolic (congestive) heart failure: Secondary | ICD-10-CM | POA: Diagnosis not present

## 2023-03-10 DIAGNOSIS — I272 Pulmonary hypertension, unspecified: Secondary | ICD-10-CM | POA: Diagnosis not present

## 2023-03-10 DIAGNOSIS — R9431 Abnormal electrocardiogram [ECG] [EKG]: Secondary | ICD-10-CM | POA: Diagnosis not present

## 2023-03-10 DIAGNOSIS — R6520 Severe sepsis without septic shock: Secondary | ICD-10-CM | POA: Diagnosis not present

## 2023-03-10 DIAGNOSIS — S7002XA Contusion of left hip, initial encounter: Secondary | ICD-10-CM | POA: Diagnosis not present

## 2023-03-10 DIAGNOSIS — E872 Acidosis, unspecified: Secondary | ICD-10-CM | POA: Diagnosis not present

## 2023-03-10 DIAGNOSIS — I50810 Right heart failure, unspecified: Secondary | ICD-10-CM | POA: Diagnosis not present

## 2023-03-10 DIAGNOSIS — R0902 Hypoxemia: Secondary | ICD-10-CM | POA: Diagnosis not present

## 2023-03-10 DIAGNOSIS — J159 Unspecified bacterial pneumonia: Secondary | ICD-10-CM | POA: Diagnosis not present

## 2023-03-10 DIAGNOSIS — J9601 Acute respiratory failure with hypoxia: Secondary | ICD-10-CM | POA: Diagnosis not present

## 2023-03-14 DIAGNOSIS — I340 Nonrheumatic mitral (valve) insufficiency: Secondary | ICD-10-CM

## 2023-03-19 DIAGNOSIS — I493 Ventricular premature depolarization: Secondary | ICD-10-CM | POA: Diagnosis not present

## 2023-03-19 DIAGNOSIS — I083 Combined rheumatic disorders of mitral, aortic and tricuspid valves: Secondary | ICD-10-CM | POA: Diagnosis not present

## 2023-03-19 DIAGNOSIS — I272 Pulmonary hypertension, unspecified: Secondary | ICD-10-CM | POA: Diagnosis not present

## 2023-03-19 DIAGNOSIS — I2510 Atherosclerotic heart disease of native coronary artery without angina pectoris: Secondary | ICD-10-CM | POA: Diagnosis not present

## 2023-03-19 DIAGNOSIS — J9621 Acute and chronic respiratory failure with hypoxia: Secondary | ICD-10-CM | POA: Diagnosis not present

## 2023-03-19 DIAGNOSIS — D72829 Elevated white blood cell count, unspecified: Secondary | ICD-10-CM | POA: Diagnosis not present

## 2023-03-19 DIAGNOSIS — M109 Gout, unspecified: Secondary | ICD-10-CM | POA: Diagnosis not present

## 2023-03-19 DIAGNOSIS — K219 Gastro-esophageal reflux disease without esophagitis: Secondary | ICD-10-CM | POA: Diagnosis not present

## 2023-03-19 DIAGNOSIS — H409 Unspecified glaucoma: Secondary | ICD-10-CM | POA: Diagnosis not present

## 2023-03-19 DIAGNOSIS — M150 Primary generalized (osteo)arthritis: Secondary | ICD-10-CM | POA: Diagnosis not present

## 2023-03-19 DIAGNOSIS — N179 Acute kidney failure, unspecified: Secondary | ICD-10-CM | POA: Diagnosis not present

## 2023-03-19 DIAGNOSIS — D631 Anemia in chronic kidney disease: Secondary | ICD-10-CM | POA: Diagnosis not present

## 2023-03-19 DIAGNOSIS — M810 Age-related osteoporosis without current pathological fracture: Secondary | ICD-10-CM | POA: Diagnosis not present

## 2023-03-19 DIAGNOSIS — D849 Immunodeficiency, unspecified: Secondary | ICD-10-CM | POA: Diagnosis not present

## 2023-03-19 DIAGNOSIS — I132 Hypertensive heart and chronic kidney disease with heart failure and with stage 5 chronic kidney disease, or end stage renal disease: Secondary | ICD-10-CM | POA: Diagnosis not present

## 2023-03-19 DIAGNOSIS — E7800 Pure hypercholesterolemia, unspecified: Secondary | ICD-10-CM | POA: Diagnosis not present

## 2023-03-19 DIAGNOSIS — M47816 Spondylosis without myelopathy or radiculopathy, lumbar region: Secondary | ICD-10-CM | POA: Diagnosis not present

## 2023-03-19 DIAGNOSIS — J84112 Idiopathic pulmonary fibrosis: Secondary | ICD-10-CM | POA: Diagnosis not present

## 2023-03-19 DIAGNOSIS — G4734 Idiopathic sleep related nonobstructive alveolar hypoventilation: Secondary | ICD-10-CM | POA: Diagnosis not present

## 2023-03-19 DIAGNOSIS — I5032 Chronic diastolic (congestive) heart failure: Secondary | ICD-10-CM | POA: Diagnosis not present

## 2023-03-19 DIAGNOSIS — D509 Iron deficiency anemia, unspecified: Secondary | ICD-10-CM | POA: Diagnosis not present

## 2023-03-19 DIAGNOSIS — G8929 Other chronic pain: Secondary | ICD-10-CM | POA: Diagnosis not present

## 2023-03-19 DIAGNOSIS — E11319 Type 2 diabetes mellitus with unspecified diabetic retinopathy without macular edema: Secondary | ICD-10-CM | POA: Diagnosis not present

## 2023-03-19 DIAGNOSIS — N185 Chronic kidney disease, stage 5: Secondary | ICD-10-CM | POA: Diagnosis not present

## 2023-03-19 DIAGNOSIS — E1122 Type 2 diabetes mellitus with diabetic chronic kidney disease: Secondary | ICD-10-CM | POA: Diagnosis not present

## 2023-03-20 DIAGNOSIS — J479 Bronchiectasis, uncomplicated: Secondary | ICD-10-CM | POA: Diagnosis not present

## 2023-03-20 DIAGNOSIS — G4736 Sleep related hypoventilation in conditions classified elsewhere: Secondary | ICD-10-CM | POA: Diagnosis not present

## 2023-03-20 DIAGNOSIS — R918 Other nonspecific abnormal finding of lung field: Secondary | ICD-10-CM | POA: Diagnosis not present

## 2023-03-20 DIAGNOSIS — J9611 Chronic respiratory failure with hypoxia: Secondary | ICD-10-CM | POA: Diagnosis not present

## 2023-03-20 DIAGNOSIS — J84112 Idiopathic pulmonary fibrosis: Secondary | ICD-10-CM | POA: Diagnosis not present

## 2023-03-21 DIAGNOSIS — Z85528 Personal history of other malignant neoplasm of kidney: Secondary | ICD-10-CM | POA: Diagnosis not present

## 2023-03-21 DIAGNOSIS — T8619 Other complication of kidney transplant: Secondary | ICD-10-CM | POA: Diagnosis not present

## 2023-03-21 DIAGNOSIS — D849 Immunodeficiency, unspecified: Secondary | ICD-10-CM | POA: Diagnosis not present

## 2023-03-21 DIAGNOSIS — C649 Malignant neoplasm of unspecified kidney, except renal pelvis: Secondary | ICD-10-CM | POA: Diagnosis not present

## 2023-03-21 DIAGNOSIS — N1832 Chronic kidney disease, stage 3b: Secondary | ICD-10-CM | POA: Diagnosis not present

## 2023-03-21 DIAGNOSIS — J8410 Pulmonary fibrosis, unspecified: Secondary | ICD-10-CM | POA: Insufficient documentation

## 2023-03-21 DIAGNOSIS — I10 Essential (primary) hypertension: Secondary | ICD-10-CM | POA: Diagnosis not present

## 2023-03-21 DIAGNOSIS — Z940 Kidney transplant status: Secondary | ICD-10-CM | POA: Diagnosis not present

## 2023-03-21 DIAGNOSIS — J189 Pneumonia, unspecified organism: Secondary | ICD-10-CM | POA: Diagnosis not present

## 2023-03-21 DIAGNOSIS — C802 Malignant neoplasm associated with transplanted organ: Secondary | ICD-10-CM | POA: Diagnosis not present

## 2023-03-21 HISTORY — DX: Pulmonary fibrosis, unspecified: J84.10

## 2023-03-25 DIAGNOSIS — I132 Hypertensive heart and chronic kidney disease with heart failure and with stage 5 chronic kidney disease, or end stage renal disease: Secondary | ICD-10-CM | POA: Diagnosis not present

## 2023-03-25 DIAGNOSIS — Z940 Kidney transplant status: Secondary | ICD-10-CM | POA: Diagnosis not present

## 2023-03-25 DIAGNOSIS — D631 Anemia in chronic kidney disease: Secondary | ICD-10-CM | POA: Diagnosis not present

## 2023-03-25 DIAGNOSIS — J159 Unspecified bacterial pneumonia: Secondary | ICD-10-CM | POA: Diagnosis not present

## 2023-03-25 DIAGNOSIS — Z9483 Pancreas transplant status: Secondary | ICD-10-CM | POA: Diagnosis not present

## 2023-03-25 DIAGNOSIS — E1122 Type 2 diabetes mellitus with diabetic chronic kidney disease: Secondary | ICD-10-CM | POA: Diagnosis not present

## 2023-03-25 DIAGNOSIS — N189 Chronic kidney disease, unspecified: Secondary | ICD-10-CM | POA: Diagnosis not present

## 2023-03-25 DIAGNOSIS — D849 Immunodeficiency, unspecified: Secondary | ICD-10-CM | POA: Diagnosis not present

## 2023-03-25 DIAGNOSIS — I5032 Chronic diastolic (congestive) heart failure: Secondary | ICD-10-CM | POA: Diagnosis not present

## 2023-03-25 DIAGNOSIS — N179 Acute kidney failure, unspecified: Secondary | ICD-10-CM | POA: Diagnosis not present

## 2023-03-25 DIAGNOSIS — N185 Chronic kidney disease, stage 5: Secondary | ICD-10-CM | POA: Diagnosis not present

## 2023-03-26 DIAGNOSIS — N179 Acute kidney failure, unspecified: Secondary | ICD-10-CM | POA: Diagnosis not present

## 2023-03-26 DIAGNOSIS — N185 Chronic kidney disease, stage 5: Secondary | ICD-10-CM | POA: Diagnosis not present

## 2023-03-26 DIAGNOSIS — I132 Hypertensive heart and chronic kidney disease with heart failure and with stage 5 chronic kidney disease, or end stage renal disease: Secondary | ICD-10-CM | POA: Diagnosis not present

## 2023-03-26 DIAGNOSIS — D631 Anemia in chronic kidney disease: Secondary | ICD-10-CM | POA: Diagnosis not present

## 2023-03-26 DIAGNOSIS — I5032 Chronic diastolic (congestive) heart failure: Secondary | ICD-10-CM | POA: Diagnosis not present

## 2023-03-26 DIAGNOSIS — E1122 Type 2 diabetes mellitus with diabetic chronic kidney disease: Secondary | ICD-10-CM | POA: Diagnosis not present

## 2023-03-28 DIAGNOSIS — I5032 Chronic diastolic (congestive) heart failure: Secondary | ICD-10-CM | POA: Diagnosis not present

## 2023-03-28 DIAGNOSIS — N179 Acute kidney failure, unspecified: Secondary | ICD-10-CM | POA: Diagnosis not present

## 2023-03-28 DIAGNOSIS — N185 Chronic kidney disease, stage 5: Secondary | ICD-10-CM | POA: Diagnosis not present

## 2023-03-28 DIAGNOSIS — E1122 Type 2 diabetes mellitus with diabetic chronic kidney disease: Secondary | ICD-10-CM | POA: Diagnosis not present

## 2023-03-28 DIAGNOSIS — I132 Hypertensive heart and chronic kidney disease with heart failure and with stage 5 chronic kidney disease, or end stage renal disease: Secondary | ICD-10-CM | POA: Diagnosis not present

## 2023-03-28 DIAGNOSIS — D631 Anemia in chronic kidney disease: Secondary | ICD-10-CM | POA: Diagnosis not present

## 2023-03-29 DIAGNOSIS — N185 Chronic kidney disease, stage 5: Secondary | ICD-10-CM | POA: Diagnosis not present

## 2023-03-29 DIAGNOSIS — D631 Anemia in chronic kidney disease: Secondary | ICD-10-CM | POA: Diagnosis not present

## 2023-03-29 DIAGNOSIS — E1122 Type 2 diabetes mellitus with diabetic chronic kidney disease: Secondary | ICD-10-CM | POA: Diagnosis not present

## 2023-03-29 DIAGNOSIS — I132 Hypertensive heart and chronic kidney disease with heart failure and with stage 5 chronic kidney disease, or end stage renal disease: Secondary | ICD-10-CM | POA: Diagnosis not present

## 2023-03-29 DIAGNOSIS — I5032 Chronic diastolic (congestive) heart failure: Secondary | ICD-10-CM | POA: Diagnosis not present

## 2023-03-29 DIAGNOSIS — N179 Acute kidney failure, unspecified: Secondary | ICD-10-CM | POA: Diagnosis not present

## 2023-04-02 DIAGNOSIS — N179 Acute kidney failure, unspecified: Secondary | ICD-10-CM | POA: Diagnosis not present

## 2023-04-02 DIAGNOSIS — D631 Anemia in chronic kidney disease: Secondary | ICD-10-CM | POA: Diagnosis not present

## 2023-04-02 DIAGNOSIS — I132 Hypertensive heart and chronic kidney disease with heart failure and with stage 5 chronic kidney disease, or end stage renal disease: Secondary | ICD-10-CM | POA: Diagnosis not present

## 2023-04-02 DIAGNOSIS — I5032 Chronic diastolic (congestive) heart failure: Secondary | ICD-10-CM | POA: Diagnosis not present

## 2023-04-02 DIAGNOSIS — E1122 Type 2 diabetes mellitus with diabetic chronic kidney disease: Secondary | ICD-10-CM | POA: Diagnosis not present

## 2023-04-02 DIAGNOSIS — N185 Chronic kidney disease, stage 5: Secondary | ICD-10-CM | POA: Diagnosis not present

## 2023-04-04 DIAGNOSIS — N179 Acute kidney failure, unspecified: Secondary | ICD-10-CM | POA: Diagnosis not present

## 2023-04-04 DIAGNOSIS — D631 Anemia in chronic kidney disease: Secondary | ICD-10-CM | POA: Diagnosis not present

## 2023-04-04 DIAGNOSIS — E1122 Type 2 diabetes mellitus with diabetic chronic kidney disease: Secondary | ICD-10-CM | POA: Diagnosis not present

## 2023-04-04 DIAGNOSIS — I5032 Chronic diastolic (congestive) heart failure: Secondary | ICD-10-CM | POA: Diagnosis not present

## 2023-04-04 DIAGNOSIS — I132 Hypertensive heart and chronic kidney disease with heart failure and with stage 5 chronic kidney disease, or end stage renal disease: Secondary | ICD-10-CM | POA: Diagnosis not present

## 2023-04-04 DIAGNOSIS — N185 Chronic kidney disease, stage 5: Secondary | ICD-10-CM | POA: Diagnosis not present

## 2023-04-05 DIAGNOSIS — N185 Chronic kidney disease, stage 5: Secondary | ICD-10-CM | POA: Diagnosis not present

## 2023-04-05 DIAGNOSIS — I132 Hypertensive heart and chronic kidney disease with heart failure and with stage 5 chronic kidney disease, or end stage renal disease: Secondary | ICD-10-CM | POA: Diagnosis not present

## 2023-04-05 DIAGNOSIS — I5032 Chronic diastolic (congestive) heart failure: Secondary | ICD-10-CM | POA: Diagnosis not present

## 2023-04-05 DIAGNOSIS — N179 Acute kidney failure, unspecified: Secondary | ICD-10-CM | POA: Diagnosis not present

## 2023-04-05 DIAGNOSIS — E1122 Type 2 diabetes mellitus with diabetic chronic kidney disease: Secondary | ICD-10-CM | POA: Diagnosis not present

## 2023-04-05 DIAGNOSIS — D631 Anemia in chronic kidney disease: Secondary | ICD-10-CM | POA: Diagnosis not present

## 2023-04-06 DIAGNOSIS — E860 Dehydration: Secondary | ICD-10-CM | POA: Diagnosis not present

## 2023-04-06 DIAGNOSIS — Z87891 Personal history of nicotine dependence: Secondary | ICD-10-CM | POA: Diagnosis not present

## 2023-04-06 DIAGNOSIS — Z79899 Other long term (current) drug therapy: Secondary | ICD-10-CM | POA: Diagnosis not present

## 2023-04-06 DIAGNOSIS — Z9483 Pancreas transplant status: Secondary | ICD-10-CM | POA: Diagnosis not present

## 2023-04-06 DIAGNOSIS — R9431 Abnormal electrocardiogram [ECG] [EKG]: Secondary | ICD-10-CM | POA: Diagnosis not present

## 2023-04-06 DIAGNOSIS — R509 Fever, unspecified: Secondary | ICD-10-CM | POA: Diagnosis not present

## 2023-04-06 DIAGNOSIS — R000 Tachycardia, unspecified: Secondary | ICD-10-CM | POA: Diagnosis not present

## 2023-04-06 DIAGNOSIS — J9611 Chronic respiratory failure with hypoxia: Secondary | ICD-10-CM | POA: Diagnosis not present

## 2023-04-06 DIAGNOSIS — A419 Sepsis, unspecified organism: Secondary | ICD-10-CM | POA: Diagnosis not present

## 2023-04-06 DIAGNOSIS — R0602 Shortness of breath: Secondary | ICD-10-CM | POA: Diagnosis not present

## 2023-04-06 DIAGNOSIS — Z940 Kidney transplant status: Secondary | ICD-10-CM | POA: Diagnosis not present

## 2023-04-06 DIAGNOSIS — J84112 Idiopathic pulmonary fibrosis: Secondary | ICD-10-CM | POA: Diagnosis not present

## 2023-04-06 DIAGNOSIS — I509 Heart failure, unspecified: Secondary | ICD-10-CM | POA: Diagnosis not present

## 2023-04-06 DIAGNOSIS — R0902 Hypoxemia: Secondary | ICD-10-CM | POA: Diagnosis not present

## 2023-04-08 DIAGNOSIS — N179 Acute kidney failure, unspecified: Secondary | ICD-10-CM | POA: Diagnosis not present

## 2023-04-08 DIAGNOSIS — J84112 Idiopathic pulmonary fibrosis: Secondary | ICD-10-CM | POA: Diagnosis not present

## 2023-04-08 DIAGNOSIS — J189 Pneumonia, unspecified organism: Secondary | ICD-10-CM | POA: Diagnosis not present

## 2023-04-08 DIAGNOSIS — N189 Chronic kidney disease, unspecified: Secondary | ICD-10-CM | POA: Diagnosis not present

## 2023-04-09 DIAGNOSIS — E1122 Type 2 diabetes mellitus with diabetic chronic kidney disease: Secondary | ICD-10-CM | POA: Diagnosis not present

## 2023-04-09 DIAGNOSIS — I5032 Chronic diastolic (congestive) heart failure: Secondary | ICD-10-CM | POA: Diagnosis not present

## 2023-04-09 DIAGNOSIS — D631 Anemia in chronic kidney disease: Secondary | ICD-10-CM | POA: Diagnosis not present

## 2023-04-09 DIAGNOSIS — I132 Hypertensive heart and chronic kidney disease with heart failure and with stage 5 chronic kidney disease, or end stage renal disease: Secondary | ICD-10-CM | POA: Diagnosis not present

## 2023-04-09 DIAGNOSIS — N179 Acute kidney failure, unspecified: Secondary | ICD-10-CM | POA: Diagnosis not present

## 2023-04-09 DIAGNOSIS — N185 Chronic kidney disease, stage 5: Secondary | ICD-10-CM | POA: Diagnosis not present

## 2023-04-10 ENCOUNTER — Telehealth: Payer: Self-pay

## 2023-04-10 DIAGNOSIS — I5032 Chronic diastolic (congestive) heart failure: Secondary | ICD-10-CM | POA: Diagnosis not present

## 2023-04-10 DIAGNOSIS — N179 Acute kidney failure, unspecified: Secondary | ICD-10-CM | POA: Diagnosis not present

## 2023-04-10 DIAGNOSIS — E1122 Type 2 diabetes mellitus with diabetic chronic kidney disease: Secondary | ICD-10-CM | POA: Diagnosis not present

## 2023-04-10 DIAGNOSIS — I132 Hypertensive heart and chronic kidney disease with heart failure and with stage 5 chronic kidney disease, or end stage renal disease: Secondary | ICD-10-CM | POA: Diagnosis not present

## 2023-04-10 DIAGNOSIS — D631 Anemia in chronic kidney disease: Secondary | ICD-10-CM | POA: Diagnosis not present

## 2023-04-10 DIAGNOSIS — N185 Chronic kidney disease, stage 5: Secondary | ICD-10-CM | POA: Diagnosis not present

## 2023-04-10 NOTE — Telephone Encounter (Signed)
     Patient  visit on 04/06/2023  at [facility] was for treatment.  Have you been able to follow up with your primary care physician? Yes  The patient was or was not able to obtain any needed medicine or equipment. Patient was able to obtain medication.  Are there diet recommendations that you are having difficulty following? No  Patient expresses understanding of discharge instructions and education provided has no other needs at this time. Yes   Lakota Markgraf [name]  [facility] Population Health Community Resource Care Guide   [email or web address]  ?? [phone]   Website: [email or web address]  [email or web address]

## 2023-04-16 DIAGNOSIS — I129 Hypertensive chronic kidney disease with stage 1 through stage 4 chronic kidney disease, or unspecified chronic kidney disease: Secondary | ICD-10-CM | POA: Diagnosis not present

## 2023-04-16 DIAGNOSIS — J9611 Chronic respiratory failure with hypoxia: Secondary | ICD-10-CM | POA: Diagnosis not present

## 2023-04-17 DIAGNOSIS — J189 Pneumonia, unspecified organism: Secondary | ICD-10-CM | POA: Diagnosis not present

## 2023-04-17 DIAGNOSIS — R918 Other nonspecific abnormal finding of lung field: Secondary | ICD-10-CM | POA: Diagnosis not present

## 2023-04-18 DIAGNOSIS — I083 Combined rheumatic disorders of mitral, aortic and tricuspid valves: Secondary | ICD-10-CM | POA: Diagnosis not present

## 2023-04-18 DIAGNOSIS — I272 Pulmonary hypertension, unspecified: Secondary | ICD-10-CM | POA: Diagnosis not present

## 2023-04-18 DIAGNOSIS — G8929 Other chronic pain: Secondary | ICD-10-CM | POA: Diagnosis not present

## 2023-04-18 DIAGNOSIS — D509 Iron deficiency anemia, unspecified: Secondary | ICD-10-CM | POA: Diagnosis not present

## 2023-04-18 DIAGNOSIS — E1122 Type 2 diabetes mellitus with diabetic chronic kidney disease: Secondary | ICD-10-CM | POA: Diagnosis not present

## 2023-04-18 DIAGNOSIS — D631 Anemia in chronic kidney disease: Secondary | ICD-10-CM | POA: Diagnosis not present

## 2023-04-18 DIAGNOSIS — J84112 Idiopathic pulmonary fibrosis: Secondary | ICD-10-CM | POA: Diagnosis not present

## 2023-04-18 DIAGNOSIS — I493 Ventricular premature depolarization: Secondary | ICD-10-CM | POA: Diagnosis not present

## 2023-04-18 DIAGNOSIS — N179 Acute kidney failure, unspecified: Secondary | ICD-10-CM | POA: Diagnosis not present

## 2023-04-18 DIAGNOSIS — E11319 Type 2 diabetes mellitus with unspecified diabetic retinopathy without macular edema: Secondary | ICD-10-CM | POA: Diagnosis not present

## 2023-04-18 DIAGNOSIS — E7800 Pure hypercholesterolemia, unspecified: Secondary | ICD-10-CM | POA: Diagnosis not present

## 2023-04-18 DIAGNOSIS — D72829 Elevated white blood cell count, unspecified: Secondary | ICD-10-CM | POA: Diagnosis not present

## 2023-04-18 DIAGNOSIS — M47816 Spondylosis without myelopathy or radiculopathy, lumbar region: Secondary | ICD-10-CM | POA: Diagnosis not present

## 2023-04-18 DIAGNOSIS — N185 Chronic kidney disease, stage 5: Secondary | ICD-10-CM | POA: Diagnosis not present

## 2023-04-18 DIAGNOSIS — K219 Gastro-esophageal reflux disease without esophagitis: Secondary | ICD-10-CM | POA: Diagnosis not present

## 2023-04-18 DIAGNOSIS — M810 Age-related osteoporosis without current pathological fracture: Secondary | ICD-10-CM | POA: Diagnosis not present

## 2023-04-18 DIAGNOSIS — I5032 Chronic diastolic (congestive) heart failure: Secondary | ICD-10-CM | POA: Diagnosis not present

## 2023-04-18 DIAGNOSIS — H409 Unspecified glaucoma: Secondary | ICD-10-CM | POA: Diagnosis not present

## 2023-04-18 DIAGNOSIS — I2510 Atherosclerotic heart disease of native coronary artery without angina pectoris: Secondary | ICD-10-CM | POA: Diagnosis not present

## 2023-04-18 DIAGNOSIS — I132 Hypertensive heart and chronic kidney disease with heart failure and with stage 5 chronic kidney disease, or end stage renal disease: Secondary | ICD-10-CM | POA: Diagnosis not present

## 2023-04-18 DIAGNOSIS — G4734 Idiopathic sleep related nonobstructive alveolar hypoventilation: Secondary | ICD-10-CM | POA: Diagnosis not present

## 2023-04-18 DIAGNOSIS — M150 Primary generalized (osteo)arthritis: Secondary | ICD-10-CM | POA: Diagnosis not present

## 2023-04-18 DIAGNOSIS — J9621 Acute and chronic respiratory failure with hypoxia: Secondary | ICD-10-CM | POA: Diagnosis not present

## 2023-04-18 DIAGNOSIS — D849 Immunodeficiency, unspecified: Secondary | ICD-10-CM | POA: Diagnosis not present

## 2023-04-18 DIAGNOSIS — M109 Gout, unspecified: Secondary | ICD-10-CM | POA: Diagnosis not present

## 2023-04-19 DIAGNOSIS — I5032 Chronic diastolic (congestive) heart failure: Secondary | ICD-10-CM | POA: Diagnosis not present

## 2023-04-19 DIAGNOSIS — I132 Hypertensive heart and chronic kidney disease with heart failure and with stage 5 chronic kidney disease, or end stage renal disease: Secondary | ICD-10-CM | POA: Diagnosis not present

## 2023-04-19 DIAGNOSIS — N185 Chronic kidney disease, stage 5: Secondary | ICD-10-CM | POA: Diagnosis not present

## 2023-04-19 DIAGNOSIS — N179 Acute kidney failure, unspecified: Secondary | ICD-10-CM | POA: Diagnosis not present

## 2023-04-19 DIAGNOSIS — D631 Anemia in chronic kidney disease: Secondary | ICD-10-CM | POA: Diagnosis not present

## 2023-04-19 DIAGNOSIS — E1122 Type 2 diabetes mellitus with diabetic chronic kidney disease: Secondary | ICD-10-CM | POA: Diagnosis not present

## 2023-04-22 DIAGNOSIS — J9611 Chronic respiratory failure with hypoxia: Secondary | ICD-10-CM | POA: Diagnosis not present

## 2023-04-22 DIAGNOSIS — G4736 Sleep related hypoventilation in conditions classified elsewhere: Secondary | ICD-10-CM | POA: Diagnosis not present

## 2023-04-22 DIAGNOSIS — J479 Bronchiectasis, uncomplicated: Secondary | ICD-10-CM | POA: Diagnosis not present

## 2023-04-22 DIAGNOSIS — J84112 Idiopathic pulmonary fibrosis: Secondary | ICD-10-CM | POA: Diagnosis not present

## 2023-04-22 DIAGNOSIS — R918 Other nonspecific abnormal finding of lung field: Secondary | ICD-10-CM | POA: Diagnosis not present

## 2023-04-23 DIAGNOSIS — D631 Anemia in chronic kidney disease: Secondary | ICD-10-CM | POA: Diagnosis not present

## 2023-04-23 DIAGNOSIS — E1122 Type 2 diabetes mellitus with diabetic chronic kidney disease: Secondary | ICD-10-CM | POA: Diagnosis not present

## 2023-04-23 DIAGNOSIS — I132 Hypertensive heart and chronic kidney disease with heart failure and with stage 5 chronic kidney disease, or end stage renal disease: Secondary | ICD-10-CM | POA: Diagnosis not present

## 2023-04-23 DIAGNOSIS — N185 Chronic kidney disease, stage 5: Secondary | ICD-10-CM | POA: Diagnosis not present

## 2023-04-23 DIAGNOSIS — N179 Acute kidney failure, unspecified: Secondary | ICD-10-CM | POA: Diagnosis not present

## 2023-04-23 DIAGNOSIS — I5032 Chronic diastolic (congestive) heart failure: Secondary | ICD-10-CM | POA: Diagnosis not present

## 2023-05-14 DIAGNOSIS — J9621 Acute and chronic respiratory failure with hypoxia: Secondary | ICD-10-CM | POA: Diagnosis not present

## 2023-05-14 DIAGNOSIS — J029 Acute pharyngitis, unspecified: Secondary | ICD-10-CM | POA: Diagnosis not present

## 2023-05-14 DIAGNOSIS — I21A1 Myocardial infarction type 2: Secondary | ICD-10-CM | POA: Diagnosis not present

## 2023-05-14 DIAGNOSIS — Z9981 Dependence on supplemental oxygen: Secondary | ICD-10-CM | POA: Diagnosis not present

## 2023-05-14 DIAGNOSIS — Z87891 Personal history of nicotine dependence: Secondary | ICD-10-CM | POA: Diagnosis not present

## 2023-05-14 DIAGNOSIS — I130 Hypertensive heart and chronic kidney disease with heart failure and stage 1 through stage 4 chronic kidney disease, or unspecified chronic kidney disease: Secondary | ICD-10-CM | POA: Diagnosis not present

## 2023-05-14 DIAGNOSIS — Z940 Kidney transplant status: Secondary | ICD-10-CM | POA: Diagnosis not present

## 2023-05-14 DIAGNOSIS — I5032 Chronic diastolic (congestive) heart failure: Secondary | ICD-10-CM | POA: Diagnosis not present

## 2023-05-14 DIAGNOSIS — E7800 Pure hypercholesterolemia, unspecified: Secondary | ICD-10-CM | POA: Diagnosis not present

## 2023-05-14 DIAGNOSIS — J159 Unspecified bacterial pneumonia: Secondary | ICD-10-CM | POA: Diagnosis not present

## 2023-05-14 DIAGNOSIS — Z792 Long term (current) use of antibiotics: Secondary | ICD-10-CM | POA: Diagnosis not present

## 2023-05-14 DIAGNOSIS — R059 Cough, unspecified: Secondary | ICD-10-CM | POA: Diagnosis not present

## 2023-05-14 DIAGNOSIS — J84112 Idiopathic pulmonary fibrosis: Secondary | ICD-10-CM | POA: Diagnosis not present

## 2023-05-14 DIAGNOSIS — I10 Essential (primary) hypertension: Secondary | ICD-10-CM | POA: Diagnosis not present

## 2023-05-14 DIAGNOSIS — Z8701 Personal history of pneumonia (recurrent): Secondary | ICD-10-CM | POA: Diagnosis not present

## 2023-05-14 DIAGNOSIS — Z7952 Long term (current) use of systemic steroids: Secondary | ICD-10-CM | POA: Diagnosis not present

## 2023-05-14 DIAGNOSIS — D84821 Immunodeficiency due to drugs: Secondary | ICD-10-CM | POA: Diagnosis not present

## 2023-05-14 DIAGNOSIS — Z9483 Pancreas transplant status: Secondary | ICD-10-CM | POA: Diagnosis not present

## 2023-05-14 DIAGNOSIS — N184 Chronic kidney disease, stage 4 (severe): Secondary | ICD-10-CM | POA: Diagnosis not present

## 2023-05-14 DIAGNOSIS — R509 Fever, unspecified: Secondary | ICD-10-CM | POA: Diagnosis not present

## 2023-05-14 DIAGNOSIS — J440 Chronic obstructive pulmonary disease with acute lower respiratory infection: Secondary | ICD-10-CM | POA: Diagnosis not present

## 2023-05-14 DIAGNOSIS — Z1152 Encounter for screening for COVID-19: Secondary | ICD-10-CM | POA: Diagnosis not present

## 2023-05-14 DIAGNOSIS — Z881 Allergy status to other antibiotic agents status: Secondary | ICD-10-CM | POA: Diagnosis not present

## 2023-05-14 DIAGNOSIS — A419 Sepsis, unspecified organism: Secondary | ICD-10-CM | POA: Diagnosis not present

## 2023-05-14 DIAGNOSIS — I2510 Atherosclerotic heart disease of native coronary artery without angina pectoris: Secondary | ICD-10-CM | POA: Diagnosis not present

## 2023-05-14 DIAGNOSIS — M1990 Unspecified osteoarthritis, unspecified site: Secondary | ICD-10-CM | POA: Diagnosis not present

## 2023-05-14 DIAGNOSIS — E119 Type 2 diabetes mellitus without complications: Secondary | ICD-10-CM | POA: Diagnosis not present

## 2023-05-14 DIAGNOSIS — Z79899 Other long term (current) drug therapy: Secondary | ICD-10-CM | POA: Diagnosis not present

## 2023-05-14 DIAGNOSIS — E1122 Type 2 diabetes mellitus with diabetic chronic kidney disease: Secondary | ICD-10-CM | POA: Diagnosis not present

## 2023-05-14 DIAGNOSIS — Z85528 Personal history of other malignant neoplasm of kidney: Secondary | ICD-10-CM | POA: Diagnosis not present

## 2023-05-23 DIAGNOSIS — N189 Chronic kidney disease, unspecified: Secondary | ICD-10-CM | POA: Diagnosis not present

## 2023-05-23 DIAGNOSIS — J9621 Acute and chronic respiratory failure with hypoxia: Secondary | ICD-10-CM | POA: Diagnosis not present

## 2023-05-23 DIAGNOSIS — E10319 Type 1 diabetes mellitus with unspecified diabetic retinopathy without macular edema: Secondary | ICD-10-CM | POA: Diagnosis not present

## 2023-05-23 DIAGNOSIS — R7989 Other specified abnormal findings of blood chemistry: Secondary | ICD-10-CM | POA: Diagnosis not present

## 2023-06-04 DIAGNOSIS — R918 Other nonspecific abnormal finding of lung field: Secondary | ICD-10-CM | POA: Diagnosis not present

## 2023-06-04 DIAGNOSIS — I700 Atherosclerosis of aorta: Secondary | ICD-10-CM | POA: Diagnosis not present

## 2023-06-04 DIAGNOSIS — J439 Emphysema, unspecified: Secondary | ICD-10-CM | POA: Diagnosis not present

## 2023-06-12 DIAGNOSIS — Z79899 Other long term (current) drug therapy: Secondary | ICD-10-CM | POA: Diagnosis not present

## 2023-06-12 DIAGNOSIS — Z6823 Body mass index (BMI) 23.0-23.9, adult: Secondary | ICD-10-CM | POA: Diagnosis not present

## 2023-06-12 DIAGNOSIS — E10319 Type 1 diabetes mellitus with unspecified diabetic retinopathy without macular edema: Secondary | ICD-10-CM | POA: Diagnosis not present

## 2023-06-12 DIAGNOSIS — N189 Chronic kidney disease, unspecified: Secondary | ICD-10-CM | POA: Diagnosis not present

## 2023-06-12 DIAGNOSIS — E785 Hyperlipidemia, unspecified: Secondary | ICD-10-CM | POA: Diagnosis not present

## 2023-06-12 DIAGNOSIS — I5032 Chronic diastolic (congestive) heart failure: Secondary | ICD-10-CM | POA: Diagnosis not present

## 2023-06-12 DIAGNOSIS — M109 Gout, unspecified: Secondary | ICD-10-CM | POA: Diagnosis not present

## 2023-06-12 DIAGNOSIS — I129 Hypertensive chronic kidney disease with stage 1 through stage 4 chronic kidney disease, or unspecified chronic kidney disease: Secondary | ICD-10-CM | POA: Diagnosis not present

## 2023-06-12 DIAGNOSIS — Z125 Encounter for screening for malignant neoplasm of prostate: Secondary | ICD-10-CM | POA: Diagnosis not present

## 2023-06-14 DIAGNOSIS — J84112 Idiopathic pulmonary fibrosis: Secondary | ICD-10-CM | POA: Diagnosis not present

## 2023-06-14 DIAGNOSIS — N390 Urinary tract infection, site not specified: Secondary | ICD-10-CM | POA: Diagnosis not present

## 2023-06-17 DIAGNOSIS — R0600 Dyspnea, unspecified: Secondary | ICD-10-CM | POA: Diagnosis not present

## 2023-06-17 DIAGNOSIS — J84112 Idiopathic pulmonary fibrosis: Secondary | ICD-10-CM | POA: Diagnosis not present

## 2023-06-21 DIAGNOSIS — J479 Bronchiectasis, uncomplicated: Secondary | ICD-10-CM | POA: Diagnosis not present

## 2023-06-21 DIAGNOSIS — J9611 Chronic respiratory failure with hypoxia: Secondary | ICD-10-CM | POA: Diagnosis not present

## 2023-06-21 DIAGNOSIS — R918 Other nonspecific abnormal finding of lung field: Secondary | ICD-10-CM | POA: Diagnosis not present

## 2023-06-21 DIAGNOSIS — G4736 Sleep related hypoventilation in conditions classified elsewhere: Secondary | ICD-10-CM | POA: Diagnosis not present

## 2023-06-21 DIAGNOSIS — J84112 Idiopathic pulmonary fibrosis: Secondary | ICD-10-CM | POA: Diagnosis not present

## 2023-07-22 DIAGNOSIS — N1832 Chronic kidney disease, stage 3b: Secondary | ICD-10-CM | POA: Diagnosis not present

## 2023-07-22 DIAGNOSIS — I10 Essential (primary) hypertension: Secondary | ICD-10-CM | POA: Diagnosis not present

## 2023-07-22 DIAGNOSIS — N184 Chronic kidney disease, stage 4 (severe): Secondary | ICD-10-CM | POA: Diagnosis not present

## 2023-07-22 DIAGNOSIS — D849 Immunodeficiency, unspecified: Secondary | ICD-10-CM | POA: Diagnosis not present

## 2023-07-22 DIAGNOSIS — E1022 Type 1 diabetes mellitus with diabetic chronic kidney disease: Secondary | ICD-10-CM | POA: Diagnosis not present

## 2023-07-22 DIAGNOSIS — Z940 Kidney transplant status: Secondary | ICD-10-CM | POA: Diagnosis not present

## 2023-08-05 DIAGNOSIS — R918 Other nonspecific abnormal finding of lung field: Secondary | ICD-10-CM | POA: Diagnosis not present

## 2023-08-05 DIAGNOSIS — J9611 Chronic respiratory failure with hypoxia: Secondary | ICD-10-CM | POA: Diagnosis not present

## 2023-08-05 DIAGNOSIS — G4736 Sleep related hypoventilation in conditions classified elsewhere: Secondary | ICD-10-CM | POA: Diagnosis not present

## 2023-08-05 DIAGNOSIS — J84112 Idiopathic pulmonary fibrosis: Secondary | ICD-10-CM | POA: Diagnosis not present

## 2023-08-05 DIAGNOSIS — J479 Bronchiectasis, uncomplicated: Secondary | ICD-10-CM | POA: Diagnosis not present

## 2023-08-11 DIAGNOSIS — U071 COVID-19: Secondary | ICD-10-CM | POA: Diagnosis not present

## 2023-08-12 DIAGNOSIS — R918 Other nonspecific abnormal finding of lung field: Secondary | ICD-10-CM | POA: Diagnosis not present

## 2023-08-12 DIAGNOSIS — I509 Heart failure, unspecified: Secondary | ICD-10-CM | POA: Diagnosis not present

## 2023-08-12 DIAGNOSIS — N185 Chronic kidney disease, stage 5: Secondary | ICD-10-CM | POA: Diagnosis not present

## 2023-08-12 DIAGNOSIS — E1122 Type 2 diabetes mellitus with diabetic chronic kidney disease: Secondary | ICD-10-CM | POA: Diagnosis not present

## 2023-08-12 DIAGNOSIS — I10 Essential (primary) hypertension: Secondary | ICD-10-CM | POA: Diagnosis not present

## 2023-08-12 DIAGNOSIS — R531 Weakness: Secondary | ICD-10-CM | POA: Diagnosis not present

## 2023-08-12 DIAGNOSIS — R232 Flushing: Secondary | ICD-10-CM | POA: Diagnosis not present

## 2023-08-12 DIAGNOSIS — R9431 Abnormal electrocardiogram [ECG] [EKG]: Secondary | ICD-10-CM | POA: Diagnosis not present

## 2023-08-12 DIAGNOSIS — J180 Bronchopneumonia, unspecified organism: Secondary | ICD-10-CM | POA: Diagnosis not present

## 2023-08-12 DIAGNOSIS — R509 Fever, unspecified: Secondary | ICD-10-CM | POA: Diagnosis not present

## 2023-08-12 DIAGNOSIS — J8410 Pulmonary fibrosis, unspecified: Secondary | ICD-10-CM | POA: Diagnosis not present

## 2023-08-12 DIAGNOSIS — I132 Hypertensive heart and chronic kidney disease with heart failure and with stage 5 chronic kidney disease, or end stage renal disease: Secondary | ICD-10-CM | POA: Diagnosis not present

## 2023-08-12 DIAGNOSIS — Z940 Kidney transplant status: Secondary | ICD-10-CM | POA: Diagnosis not present

## 2023-08-12 DIAGNOSIS — I2109 ST elevation (STEMI) myocardial infarction involving other coronary artery of anterior wall: Secondary | ICD-10-CM | POA: Diagnosis not present

## 2023-08-12 DIAGNOSIS — Z87891 Personal history of nicotine dependence: Secondary | ICD-10-CM | POA: Diagnosis not present

## 2023-08-12 DIAGNOSIS — I491 Atrial premature depolarization: Secondary | ICD-10-CM | POA: Diagnosis not present

## 2023-08-12 DIAGNOSIS — R110 Nausea: Secondary | ICD-10-CM | POA: Diagnosis not present

## 2023-08-12 DIAGNOSIS — U071 COVID-19: Secondary | ICD-10-CM | POA: Diagnosis not present

## 2023-08-20 ENCOUNTER — Telehealth: Payer: Self-pay

## 2023-08-20 NOTE — Telephone Encounter (Signed)
Transition Care Management Follow-up Telephone Call Date of discharge and from where: 08/12/2023 [facility] How have you been since you were released from the hospital? Patient is feeling better, no temperature, no vomiting and has 3 days left to complete antibiotic. Any questions or concerns? No  Items Reviewed: Did the pt receive and understand the discharge instructions provided? Yes  Medications obtained and verified? Yes  Other? No  Any new allergies since your discharge? No  Dietary orders reviewed? Yes Do you have support at home? Yes   Follow up appointments reviewed:  PCP Hospital f/u appt confirmed?  Patient he has seen his PCP.  Scheduled to see  on  @ . Specialist Hospital f/u appt confirmed? No  Scheduled to see  on  @ . Are transportation arrangements needed? No  If their condition worsens, is the pt aware to call PCP or go to the Emergency Dept.? Yes Was the patient provided with contact information for the PCP's office or ED? Yes Was to pt encouraged to call back with questions or concerns? Yes  Lucas Dunn [facility]  [facility] Population Health Community Resource Care Guide   [email or web address]  ?? [phone]   Website: [email or web address]  [email or web address]

## 2023-08-30 DIAGNOSIS — Z87891 Personal history of nicotine dependence: Secondary | ICD-10-CM | POA: Diagnosis not present

## 2023-08-30 DIAGNOSIS — R918 Other nonspecific abnormal finding of lung field: Secondary | ICD-10-CM | POA: Diagnosis not present

## 2023-08-30 DIAGNOSIS — R9389 Abnormal findings on diagnostic imaging of other specified body structures: Secondary | ICD-10-CM | POA: Diagnosis not present

## 2023-08-30 DIAGNOSIS — Z940 Kidney transplant status: Secondary | ICD-10-CM | POA: Diagnosis not present

## 2023-08-30 DIAGNOSIS — Z8616 Personal history of COVID-19: Secondary | ICD-10-CM | POA: Diagnosis not present

## 2023-08-30 DIAGNOSIS — Z85528 Personal history of other malignant neoplasm of kidney: Secondary | ICD-10-CM | POA: Diagnosis not present

## 2023-08-30 DIAGNOSIS — C649 Malignant neoplasm of unspecified kidney, except renal pelvis: Secondary | ICD-10-CM | POA: Diagnosis not present

## 2023-08-30 DIAGNOSIS — Z08 Encounter for follow-up examination after completed treatment for malignant neoplasm: Secondary | ICD-10-CM | POA: Diagnosis not present

## 2023-08-30 DIAGNOSIS — J439 Emphysema, unspecified: Secondary | ICD-10-CM | POA: Diagnosis not present

## 2023-09-04 DIAGNOSIS — E103591 Type 1 diabetes mellitus with proliferative diabetic retinopathy without macular edema, right eye: Secondary | ICD-10-CM | POA: Diagnosis not present

## 2023-09-25 DIAGNOSIS — Z6823 Body mass index (BMI) 23.0-23.9, adult: Secondary | ICD-10-CM | POA: Diagnosis not present

## 2023-09-25 DIAGNOSIS — Z9181 History of falling: Secondary | ICD-10-CM | POA: Diagnosis not present

## 2023-09-25 DIAGNOSIS — Z79899 Other long term (current) drug therapy: Secondary | ICD-10-CM | POA: Diagnosis not present

## 2023-09-25 DIAGNOSIS — I5032 Chronic diastolic (congestive) heart failure: Secondary | ICD-10-CM | POA: Diagnosis not present

## 2023-09-25 DIAGNOSIS — J9611 Chronic respiratory failure with hypoxia: Secondary | ICD-10-CM | POA: Diagnosis not present

## 2023-09-25 DIAGNOSIS — I129 Hypertensive chronic kidney disease with stage 1 through stage 4 chronic kidney disease, or unspecified chronic kidney disease: Secondary | ICD-10-CM | POA: Diagnosis not present

## 2023-09-25 DIAGNOSIS — M109 Gout, unspecified: Secondary | ICD-10-CM | POA: Diagnosis not present

## 2023-09-25 DIAGNOSIS — E10319 Type 1 diabetes mellitus with unspecified diabetic retinopathy without macular edema: Secondary | ICD-10-CM | POA: Diagnosis not present

## 2023-09-25 DIAGNOSIS — N189 Chronic kidney disease, unspecified: Secondary | ICD-10-CM | POA: Diagnosis not present

## 2023-09-25 DIAGNOSIS — E785 Hyperlipidemia, unspecified: Secondary | ICD-10-CM | POA: Diagnosis not present

## 2023-09-25 DIAGNOSIS — Z1331 Encounter for screening for depression: Secondary | ICD-10-CM | POA: Diagnosis not present

## 2023-10-02 DIAGNOSIS — Z23 Encounter for immunization: Secondary | ICD-10-CM | POA: Diagnosis not present

## 2023-11-11 DIAGNOSIS — J9611 Chronic respiratory failure with hypoxia: Secondary | ICD-10-CM | POA: Diagnosis not present

## 2023-11-11 DIAGNOSIS — J84112 Idiopathic pulmonary fibrosis: Secondary | ICD-10-CM | POA: Diagnosis not present

## 2023-11-11 DIAGNOSIS — G4736 Sleep related hypoventilation in conditions classified elsewhere: Secondary | ICD-10-CM | POA: Diagnosis not present

## 2023-11-11 DIAGNOSIS — J479 Bronchiectasis, uncomplicated: Secondary | ICD-10-CM | POA: Diagnosis not present

## 2023-12-24 ENCOUNTER — Encounter: Payer: Self-pay | Admitting: Cardiology

## 2023-12-24 ENCOUNTER — Ambulatory Visit: Payer: Medicare Other | Attending: Cardiology | Admitting: Cardiology

## 2023-12-24 VITALS — BP 138/78 | HR 87 | Ht 66.0 in | Wt 156.6 lb

## 2023-12-24 DIAGNOSIS — Z940 Kidney transplant status: Secondary | ICD-10-CM | POA: Diagnosis not present

## 2023-12-24 DIAGNOSIS — N1831 Chronic kidney disease, stage 3a: Secondary | ICD-10-CM

## 2023-12-24 DIAGNOSIS — I10 Essential (primary) hypertension: Secondary | ICD-10-CM

## 2023-12-24 DIAGNOSIS — E782 Mixed hyperlipidemia: Secondary | ICD-10-CM | POA: Diagnosis not present

## 2023-12-24 NOTE — Patient Instructions (Signed)

## 2023-12-24 NOTE — Progress Notes (Signed)
 Cardiology Office Note:    Date:  12/24/2023   ID:  Lucas Dunn, DOB 17-Mar-1946, MRN [id number]  PCP:  Keren Vicenta BRAVO, MD  Cardiologist:  Jennifer JONELLE Crape, MD   Referring MD: Keren Vicenta BRAVO, MD    ASSESSMENT:    1. Mixed dyslipidemia   2. Essential hypertension   3. Stage 3a chronic kidney disease (HCC)    PLAN:    In order of problems listed above:  Coronary artery disease: Secondary prevention stressed with the patient.  Importance of compliance with diet medications stressed and he vocalized understanding.  He was advised to ambulate and exercise to the best of his ability. Essential hypertension: Blood pressure is stable and diet was emphasized. Mixed dyslipidemia: On lipid-lowering medications.  Lipids reviewed from [facility] sheet and discussed with the patient. 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  Stable and managed by primary care. Patient will be seen in follow-up appointment in 12 months or earlier if the patient has any concerns.    Medication Adjustments/Labs and Tests Ordered: Current medicines are reviewed at length with the patient today.  Concerns regarding medicines are outlined above.  Orders Placed This Encounter  Procedures   EKG 12-Lead   No orders of the defined types were placed in this encounter.    No chief complaint on file.    History of Present Illness:    Lucas Dunn is a 77 y.o. male.  Patient has past medical history of coronary artery disease, essential hypertension, mixed dyslipidemia, post renal transplant and renal insufficiency.  He has had multiple hospitalizations to the hospital.  He has history of pulmonary embolism.  He also is being treated for pulmonary fibrosis.  Overall he leads a fairly active lifestyle without any issues.  His wife accompanies him for this visit.  At the time of my evaluation, the patient is alert awake oriented and in no distress.  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 Uses 2L via [place]   Nuclear sclerotic cataract of left eye 10/10/2012   Osteopenia 10/10/2012   Formatting of this note might be different from the original. A. Last BMD 09/2004.   PDR (proliferative diabetic retinopathy) (HCC) 10/10/2012   Formatting of this note might be different from the original. 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 A. 1B, 1DR match.        B. Kidney cold ischemia time 18 hours.       C. 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     D. Zenapax induction; 09/13/00 discharge creatinine 3.2.   E. 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   F. Negative renal transplant artery arteriogram 6/04.   Stage 3a chronic kidney disease (HCC) 02/11/2020   Type 1 diabetes mellitus (HCC) 10/14/2012   Formatting of this note might be different from the original. A. ESRD 10/99. B. Retinopathy C. Mild neuropathy.    Past Surgical History:  Procedure Laterality Date   COMBINED KIDNEY-PANCREAS TRANSPLANT     KIDNEY TRANSPLANT      Current Medications: Current Meds  Medication Sig   ALBUTEROL  IN Inhale 1 vial into the lungs as needed (wheezing or shortness of breath).   allopurinol (ZYLOPRIM) 100 MG tablet Take 150 mg by mouth daily.   amLODipine (NORVASC) 5 MG tablet Take 5 mg by mouth daily.   atorvastatin (LIPITOR) 20 MG tablet Take 20 mg by mouth at bedtime.   Calcium Carbonate-Vitamin D 600-200 MG-UNIT TABS Take 1 tablet by mouth 2 (two) times daily with a meal.   furosemide (LASIX) 20 MG tablet Take 40 mg by mouth every morning.   mycophenolate (CELLCEPT) 250 MG capsule Take 250 mg by mouth every 12 (twelve) hours.   OFEV 150 MG CAPS Take 150 mg by mouth 2 (two) times daily.   predniSONE (DELTASONE) 5 MG tablet Take 5 mg by mouth daily.   ROCKLATAN 0.02-0.005 % SOLN Place 1 drop into the right eye at bedtime.   tacrolimus  (PROGRAF ) 0.5 MG capsule Take 2 capsules by mouth every morning. And take 1 tablet in the evening     Allergies:   Latex, Levofloxacin, and Other   Social History   Socioeconomic History   Marital status: Married    Spouse name: Not on file   Number of children: Not on file   Years of education: Not on file   Highest education level: Not on file  Occupational History   Not on file  Tobacco Use   Smoking status: Former   Smokeless tobacco: Never  Substance and Sexual Activity   Alcohol use: Not on file   Drug use: Not on file    Sexual activity: Not on file  Other Topics Concern   Not on file  Social History Narrative   Not on file   Social Drivers of Health   Financial Resource Strain: Not on file  Food Insecurity: No Food Insecurity (07/22/2023)   Received from [facility]   Hunger Vital Sign    Worried About Running Out of Food in the Last Year: Never true    Ran Out of Food in the Last Year: Never true  Transportation Needs: No Transportation Needs (07/22/2023)   [facility] - Transportation    In the past 12 months, has lack of transportation kept you from medical appointments or from getting medications?: No    Lack of Transportation (Non-Medical): No  Physical Activity: Not on file  Stress: Not on file  Social Connections: Not on file     Family History: The patient's family history includes Diabetes in his brother; Heart disease in his father; Hypertension in his brother, father, and mother.  ROS:   Please see the history of present illness.    All other systems reviewed and are negative.  EKGs/Labs/Other Studies Reviewed:    The following studies were reviewed today: .SABRAEKG Interpretation Date/Time:  Tuesday December 24 2023 14:11:02 EST Ventricular Rate:  87 PR Interval:  142 QRS Duration:  88 QT Interval:  392 QTC Calculation: 471 R Axis:   -24  Text Interpretation: Sinus rhythm with Premature atrial complexes in a pattern of bigeminy Anterior infarct (cited on or before 20-Oct-1998) Abnormal ECG When compared with ECG of 20-Oct-1998 10:01, Questionable change in initial forces of Anterior leads T wave inversion now evident in Anterior leads T wave amplitude has increased in Lateral leads Confirmed by Edwyna Backers [phone]) on 12/24/2023 1:18:51 PM     Recent Labs: No results found for requested labs within last 365 days.  Recent Lipid Panel No results found for: CHOL, TRIG, HDL, CHOLHDL, VLDL, LDLCALC,  LDLDIRECT  Physical Exam:    VS:  BP 138/78   Pulse 87   Ht 5' 6 (1.676 m)   Wt 156 lb 9.6 oz (71 kg)   SpO2 90%   BMI 25.28 kg/m     Wt Readings from Last 3 Encounters:  12/24/23 156 lb 9.6 oz (71 kg)  02/25/23 159 lb 9.6 oz (72.4 kg)  09/27/22 157 lb 12.8 oz (71.6 kg)     GEN: Patient is in no acute distress HEENT: Normal NECK: No JVD; No carotid bruits LYMPHATICS: No lymphadenopathy CARDIAC: Hear sounds regular, 2/6 systolic murmur at the apex. RESPIRATORY:  Clear to auscultation without rales, wheezing or rhonchi  ABDOMEN: Soft, non-tender, non-distended MUSCULOSKELETAL:  No edema; No deformity  SKIN: Warm and dry NEUROLOGIC:  Alert and oriented x 3 PSYCHIATRIC:  Normal affect   Signed, Backers JONELLE Edwyna, MD  12/24/2023 1:20 PM    [facility]

## 2024-01-02 DIAGNOSIS — I129 Hypertensive chronic kidney disease with stage 1 through stage 4 chronic kidney disease, or unspecified chronic kidney disease: Secondary | ICD-10-CM | POA: Diagnosis not present

## 2024-01-02 DIAGNOSIS — E10319 Type 1 diabetes mellitus with unspecified diabetic retinopathy without macular edema: Secondary | ICD-10-CM | POA: Diagnosis not present

## 2024-01-02 DIAGNOSIS — I5032 Chronic diastolic (congestive) heart failure: Secondary | ICD-10-CM | POA: Diagnosis not present

## 2024-01-02 DIAGNOSIS — E785 Hyperlipidemia, unspecified: Secondary | ICD-10-CM | POA: Diagnosis not present

## 2024-01-02 DIAGNOSIS — N189 Chronic kidney disease, unspecified: Secondary | ICD-10-CM | POA: Diagnosis not present

## 2024-01-02 DIAGNOSIS — Z23 Encounter for immunization: Secondary | ICD-10-CM | POA: Diagnosis not present

## 2024-01-02 DIAGNOSIS — M109 Gout, unspecified: Secondary | ICD-10-CM | POA: Diagnosis not present

## 2024-01-02 DIAGNOSIS — J9611 Chronic respiratory failure with hypoxia: Secondary | ICD-10-CM | POA: Diagnosis not present

## 2024-01-02 DIAGNOSIS — Z79899 Other long term (current) drug therapy: Secondary | ICD-10-CM | POA: Diagnosis not present

## 2024-01-08 DIAGNOSIS — G4733 Obstructive sleep apnea (adult) (pediatric): Secondary | ICD-10-CM | POA: Diagnosis not present

## 2024-01-23 DIAGNOSIS — Z940 Kidney transplant status: Secondary | ICD-10-CM | POA: Diagnosis not present

## 2024-01-23 DIAGNOSIS — D849 Immunodeficiency, unspecified: Secondary | ICD-10-CM | POA: Diagnosis not present

## 2024-01-23 DIAGNOSIS — N184 Chronic kidney disease, stage 4 (severe): Secondary | ICD-10-CM | POA: Diagnosis not present

## 2024-01-23 DIAGNOSIS — I10 Essential (primary) hypertension: Secondary | ICD-10-CM | POA: Diagnosis not present

## 2024-01-23 DIAGNOSIS — Z9483 Pancreas transplant status: Secondary | ICD-10-CM | POA: Diagnosis not present

## 2024-02-10 DIAGNOSIS — J84112 Idiopathic pulmonary fibrosis: Secondary | ICD-10-CM | POA: Diagnosis not present

## 2024-02-10 DIAGNOSIS — Z79899 Other long term (current) drug therapy: Secondary | ICD-10-CM | POA: Diagnosis not present

## 2024-02-10 DIAGNOSIS — G4736 Sleep related hypoventilation in conditions classified elsewhere: Secondary | ICD-10-CM | POA: Diagnosis not present

## 2024-02-10 DIAGNOSIS — J479 Bronchiectasis, uncomplicated: Secondary | ICD-10-CM | POA: Diagnosis not present

## 2024-02-10 DIAGNOSIS — J9611 Chronic respiratory failure with hypoxia: Secondary | ICD-10-CM | POA: Diagnosis not present

## 2024-02-28 DIAGNOSIS — G4733 Obstructive sleep apnea (adult) (pediatric): Secondary | ICD-10-CM | POA: Diagnosis not present

## 2024-04-06 DIAGNOSIS — G4736 Sleep related hypoventilation in conditions classified elsewhere: Secondary | ICD-10-CM | POA: Diagnosis not present

## 2024-04-06 DIAGNOSIS — J84112 Idiopathic pulmonary fibrosis: Secondary | ICD-10-CM | POA: Diagnosis not present

## 2024-04-06 DIAGNOSIS — M109 Gout, unspecified: Secondary | ICD-10-CM | POA: Diagnosis not present

## 2024-04-06 DIAGNOSIS — E785 Hyperlipidemia, unspecified: Secondary | ICD-10-CM | POA: Diagnosis not present

## 2024-04-06 DIAGNOSIS — J9611 Chronic respiratory failure with hypoxia: Secondary | ICD-10-CM | POA: Diagnosis not present

## 2024-04-06 DIAGNOSIS — I129 Hypertensive chronic kidney disease with stage 1 through stage 4 chronic kidney disease, or unspecified chronic kidney disease: Secondary | ICD-10-CM | POA: Diagnosis not present

## 2024-04-06 DIAGNOSIS — Z79899 Other long term (current) drug therapy: Secondary | ICD-10-CM | POA: Diagnosis not present

## 2024-04-06 DIAGNOSIS — J479 Bronchiectasis, uncomplicated: Secondary | ICD-10-CM | POA: Diagnosis not present

## 2024-04-06 DIAGNOSIS — E10319 Type 1 diabetes mellitus with unspecified diabetic retinopathy without macular edema: Secondary | ICD-10-CM | POA: Diagnosis not present

## 2024-04-06 DIAGNOSIS — N189 Chronic kidney disease, unspecified: Secondary | ICD-10-CM | POA: Diagnosis not present

## 2024-04-06 DIAGNOSIS — I5032 Chronic diastolic (congestive) heart failure: Secondary | ICD-10-CM | POA: Diagnosis not present

## 2024-05-06 DIAGNOSIS — E103591 Type 1 diabetes mellitus with proliferative diabetic retinopathy without macular edema, right eye: Secondary | ICD-10-CM | POA: Diagnosis not present

## 2024-05-21 DIAGNOSIS — N184 Chronic kidney disease, stage 4 (severe): Secondary | ICD-10-CM | POA: Diagnosis not present

## 2024-05-21 DIAGNOSIS — Z940 Kidney transplant status: Secondary | ICD-10-CM | POA: Diagnosis not present

## 2024-05-21 DIAGNOSIS — N1832 Chronic kidney disease, stage 3b: Secondary | ICD-10-CM | POA: Diagnosis not present

## 2024-05-21 DIAGNOSIS — E1022 Type 1 diabetes mellitus with diabetic chronic kidney disease: Secondary | ICD-10-CM | POA: Diagnosis not present

## 2024-05-21 DIAGNOSIS — I10 Essential (primary) hypertension: Secondary | ICD-10-CM | POA: Diagnosis not present

## 2024-06-12 DIAGNOSIS — J479 Bronchiectasis, uncomplicated: Secondary | ICD-10-CM | POA: Diagnosis not present

## 2024-06-12 DIAGNOSIS — J9611 Chronic respiratory failure with hypoxia: Secondary | ICD-10-CM | POA: Diagnosis not present

## 2024-06-12 DIAGNOSIS — J84112 Idiopathic pulmonary fibrosis: Secondary | ICD-10-CM | POA: Diagnosis not present

## 2024-06-12 DIAGNOSIS — G4733 Obstructive sleep apnea (adult) (pediatric): Secondary | ICD-10-CM | POA: Diagnosis not present

## 2024-07-07 DIAGNOSIS — N189 Chronic kidney disease, unspecified: Secondary | ICD-10-CM | POA: Diagnosis not present

## 2024-07-07 DIAGNOSIS — M109 Gout, unspecified: Secondary | ICD-10-CM | POA: Diagnosis not present

## 2024-07-07 DIAGNOSIS — E10319 Type 1 diabetes mellitus with unspecified diabetic retinopathy without macular edema: Secondary | ICD-10-CM | POA: Diagnosis not present

## 2024-07-07 DIAGNOSIS — J9611 Chronic respiratory failure with hypoxia: Secondary | ICD-10-CM | POA: Diagnosis not present

## 2024-07-07 DIAGNOSIS — Z125 Encounter for screening for malignant neoplasm of prostate: Secondary | ICD-10-CM | POA: Diagnosis not present

## 2024-07-07 DIAGNOSIS — Z79899 Other long term (current) drug therapy: Secondary | ICD-10-CM | POA: Diagnosis not present

## 2024-07-07 DIAGNOSIS — E785 Hyperlipidemia, unspecified: Secondary | ICD-10-CM | POA: Diagnosis not present

## 2024-07-07 DIAGNOSIS — I5032 Chronic diastolic (congestive) heart failure: Secondary | ICD-10-CM | POA: Diagnosis not present

## 2024-07-07 DIAGNOSIS — I129 Hypertensive chronic kidney disease with stage 1 through stage 4 chronic kidney disease, or unspecified chronic kidney disease: Secondary | ICD-10-CM | POA: Diagnosis not present

## 2024-08-12 DIAGNOSIS — J479 Bronchiectasis, uncomplicated: Secondary | ICD-10-CM | POA: Diagnosis not present

## 2024-08-12 DIAGNOSIS — G4733 Obstructive sleep apnea (adult) (pediatric): Secondary | ICD-10-CM | POA: Diagnosis not present

## 2024-08-12 DIAGNOSIS — J84112 Idiopathic pulmonary fibrosis: Secondary | ICD-10-CM | POA: Diagnosis not present

## 2024-08-12 DIAGNOSIS — J9611 Chronic respiratory failure with hypoxia: Secondary | ICD-10-CM | POA: Diagnosis not present

## 2024-08-31 DIAGNOSIS — Z87891 Personal history of nicotine dependence: Secondary | ICD-10-CM | POA: Diagnosis not present

## 2024-08-31 DIAGNOSIS — C649 Malignant neoplasm of unspecified kidney, except renal pelvis: Secondary | ICD-10-CM | POA: Diagnosis not present

## 2024-08-31 DIAGNOSIS — Z85528 Personal history of other malignant neoplasm of kidney: Secondary | ICD-10-CM | POA: Diagnosis not present

## 2024-08-31 DIAGNOSIS — Z08 Encounter for follow-up examination after completed treatment for malignant neoplasm: Secondary | ICD-10-CM | POA: Diagnosis not present

## 2024-08-31 DIAGNOSIS — Z7722 Contact with and (suspected) exposure to environmental tobacco smoke (acute) (chronic): Secondary | ICD-10-CM | POA: Diagnosis not present

## 2024-09-10 DIAGNOSIS — Z9181 History of falling: Secondary | ICD-10-CM | POA: Diagnosis not present

## 2024-09-10 DIAGNOSIS — Z0000 Encounter for general adult medical examination without abnormal findings: Secondary | ICD-10-CM | POA: Diagnosis not present

## 2024-09-30 DIAGNOSIS — Z23 Encounter for immunization: Secondary | ICD-10-CM | POA: Diagnosis not present

## 2024-10-15 DIAGNOSIS — N189 Chronic kidney disease, unspecified: Secondary | ICD-10-CM | POA: Diagnosis not present

## 2024-10-15 DIAGNOSIS — I129 Hypertensive chronic kidney disease with stage 1 through stage 4 chronic kidney disease, or unspecified chronic kidney disease: Secondary | ICD-10-CM | POA: Diagnosis not present

## 2024-10-15 DIAGNOSIS — E10319 Type 1 diabetes mellitus with unspecified diabetic retinopathy without macular edema: Secondary | ICD-10-CM | POA: Diagnosis not present

## 2024-10-15 DIAGNOSIS — E785 Hyperlipidemia, unspecified: Secondary | ICD-10-CM | POA: Diagnosis not present

## 2024-10-15 DIAGNOSIS — J9611 Chronic respiratory failure with hypoxia: Secondary | ICD-10-CM | POA: Diagnosis not present

## 2024-10-15 DIAGNOSIS — I5032 Chronic diastolic (congestive) heart failure: Secondary | ICD-10-CM | POA: Diagnosis not present

## 2024-10-15 DIAGNOSIS — M109 Gout, unspecified: Secondary | ICD-10-CM | POA: Diagnosis not present

## 2024-10-15 DIAGNOSIS — Z79899 Other long term (current) drug therapy: Secondary | ICD-10-CM | POA: Diagnosis not present

## 2024-10-26 DIAGNOSIS — J84112 Idiopathic pulmonary fibrosis: Secondary | ICD-10-CM | POA: Diagnosis not present

## 2024-10-26 DIAGNOSIS — J9611 Chronic respiratory failure with hypoxia: Secondary | ICD-10-CM | POA: Diagnosis not present

## 2024-11-09 DIAGNOSIS — J479 Bronchiectasis, uncomplicated: Secondary | ICD-10-CM | POA: Diagnosis not present

## 2024-11-09 DIAGNOSIS — R918 Other nonspecific abnormal finding of lung field: Secondary | ICD-10-CM | POA: Diagnosis not present

## 2024-11-09 DIAGNOSIS — G4733 Obstructive sleep apnea (adult) (pediatric): Secondary | ICD-10-CM | POA: Diagnosis not present

## 2024-11-23 DIAGNOSIS — N184 Chronic kidney disease, stage 4 (severe): Secondary | ICD-10-CM | POA: Diagnosis not present

## 2024-11-23 DIAGNOSIS — D849 Immunodeficiency, unspecified: Secondary | ICD-10-CM | POA: Diagnosis not present

## 2024-11-23 DIAGNOSIS — E1022 Type 1 diabetes mellitus with diabetic chronic kidney disease: Secondary | ICD-10-CM | POA: Diagnosis not present

## 2024-11-23 DIAGNOSIS — I10 Essential (primary) hypertension: Secondary | ICD-10-CM | POA: Diagnosis not present

## 2024-11-23 DIAGNOSIS — Z940 Kidney transplant status: Secondary | ICD-10-CM | POA: Diagnosis not present

## 2024-11-30 DIAGNOSIS — N189 Chronic kidney disease, unspecified: Secondary | ICD-10-CM | POA: Diagnosis not present

## 2024-12-08 ENCOUNTER — Ambulatory Visit: Admitting: Cardiology

## 2024-12-08 DIAGNOSIS — I361 Nonrheumatic tricuspid (valve) insufficiency: Secondary | ICD-10-CM | POA: Diagnosis not present

## 2024-12-08 DIAGNOSIS — I517 Cardiomegaly: Secondary | ICD-10-CM | POA: Diagnosis not present

## 2024-12-08 DIAGNOSIS — I371 Nonrheumatic pulmonary valve insufficiency: Secondary | ICD-10-CM | POA: Diagnosis not present

## 2024-12-09 DIAGNOSIS — N185 Chronic kidney disease, stage 5: Secondary | ICD-10-CM | POA: Diagnosis not present

## 2024-12-09 DIAGNOSIS — I2510 Atherosclerotic heart disease of native coronary artery without angina pectoris: Secondary | ICD-10-CM | POA: Diagnosis not present

## 2024-12-09 DIAGNOSIS — I509 Heart failure, unspecified: Secondary | ICD-10-CM | POA: Diagnosis not present

## 2024-12-12 DIAGNOSIS — I491 Atrial premature depolarization: Secondary | ICD-10-CM | POA: Diagnosis not present

## 2024-12-12 DIAGNOSIS — R000 Tachycardia, unspecified: Secondary | ICD-10-CM | POA: Diagnosis not present

## 2024-12-15 ENCOUNTER — Ambulatory Visit: Admitting: Cardiology

## 2025-01-13 ENCOUNTER — Ambulatory Visit: Admitting: Cardiology

## 2025-01-24 DEATH — deceased
